# Patient Record
Sex: Female | Born: 1975 | Race: Black or African American | Hispanic: No | Marital: Married | Smoking: Former smoker
Health system: Southern US, Community
[De-identification: ages and names within clinical notes are randomized; demographics above are authoritative.]

## PROBLEM LIST (undated history)

## (undated) ENCOUNTER — Emergency Department (HOSPITAL_COMMUNITY): Discharge status: Against Medical Advice | Payer: Medicaid Other

## (undated) DIAGNOSIS — L680 Hirsutism: Secondary | ICD-10-CM

## (undated) HISTORY — DX: Hirsutism: L68.0

---

## 2003-06-04 ENCOUNTER — Other Ambulatory Visit: Admission: RE | Admit: 2003-06-04 | Discharge: 2003-06-04 | Payer: Self-pay | Admitting: Obstetrics and Gynecology

## 2005-12-30 ENCOUNTER — Emergency Department (HOSPITAL_COMMUNITY): Admission: EM | Admit: 2005-12-30 | Discharge: 2005-12-30 | Payer: Self-pay | Admitting: Emergency Medicine

## 2006-10-21 ENCOUNTER — Ambulatory Visit (HOSPITAL_COMMUNITY): Admission: RE | Admit: 2006-10-21 | Discharge: 2006-10-21 | Payer: Self-pay | Admitting: Obstetrics

## 2006-11-04 ENCOUNTER — Ambulatory Visit (HOSPITAL_COMMUNITY): Admission: RE | Admit: 2006-11-04 | Discharge: 2006-11-04 | Payer: Self-pay | Admitting: Obstetrics

## 2007-01-13 ENCOUNTER — Ambulatory Visit (HOSPITAL_COMMUNITY): Admission: RE | Admit: 2007-01-13 | Discharge: 2007-01-13 | Payer: Self-pay | Admitting: Obstetrics

## 2007-01-30 ENCOUNTER — Inpatient Hospital Stay (HOSPITAL_COMMUNITY): Admission: AD | Admit: 2007-01-30 | Discharge: 2007-01-30 | Payer: Self-pay | Admitting: Obstetrics & Gynecology

## 2007-03-26 ENCOUNTER — Inpatient Hospital Stay (HOSPITAL_COMMUNITY): Admission: RE | Admit: 2007-03-26 | Discharge: 2007-03-29 | Payer: Self-pay | Admitting: Obstetrics

## 2008-04-26 IMAGING — US US OB COMP +14 WK
1 series · 13 of 28 positions shown · non-contrast
Comparison: none

CLINICAL DATA: Anatomic exam.  No current problems.

[Series 1: us ob comp +14 wk · 0.33mm/px · 53 acquisitions, 13 frames shown]
[im 2/53]
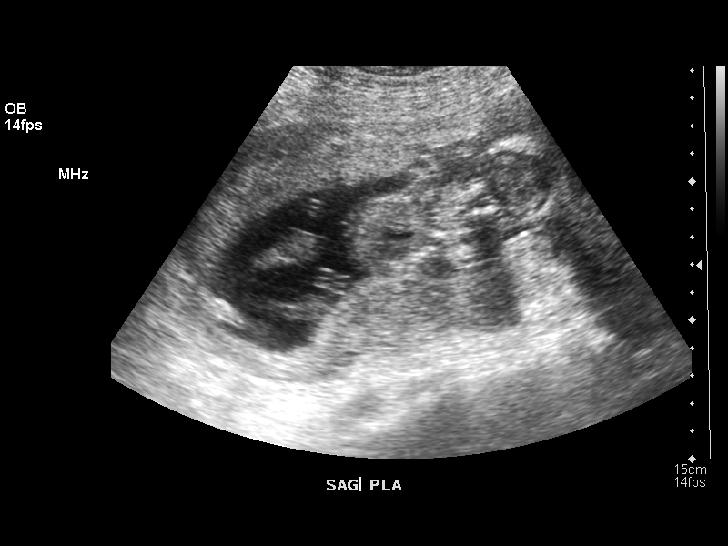
[im 6/53]
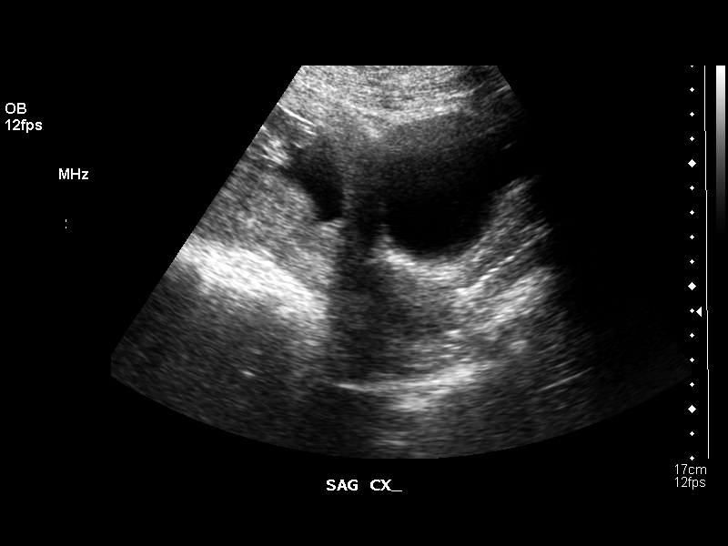
[im 10/53]
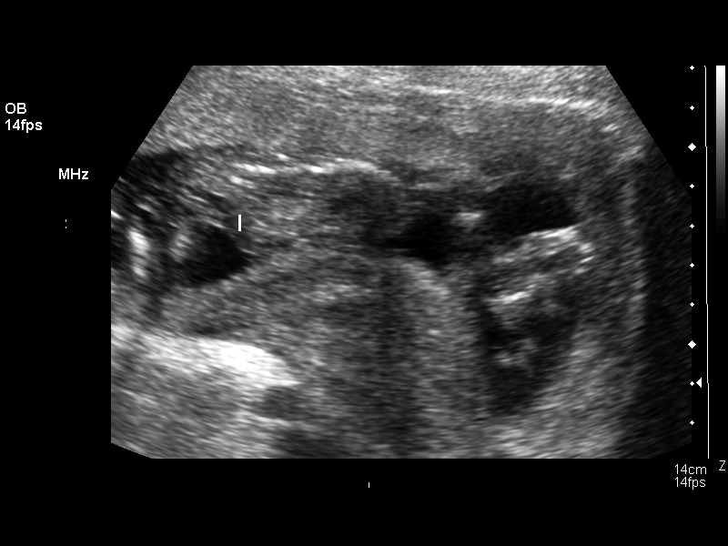
[im 14/53]
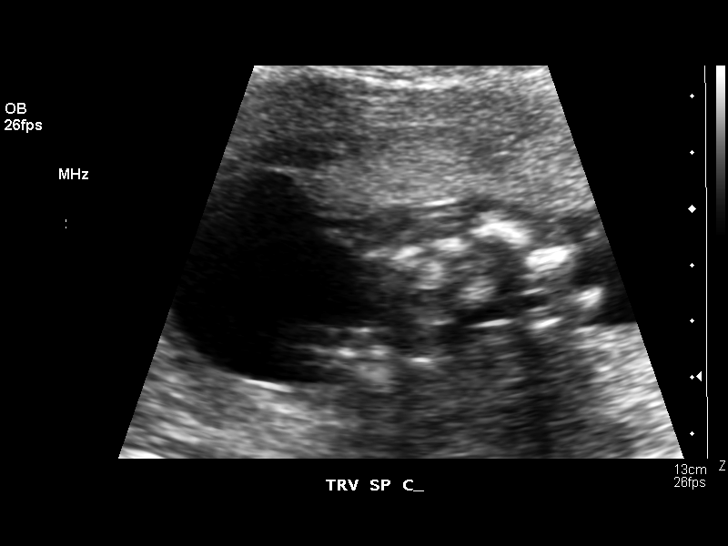
[im 18/53]
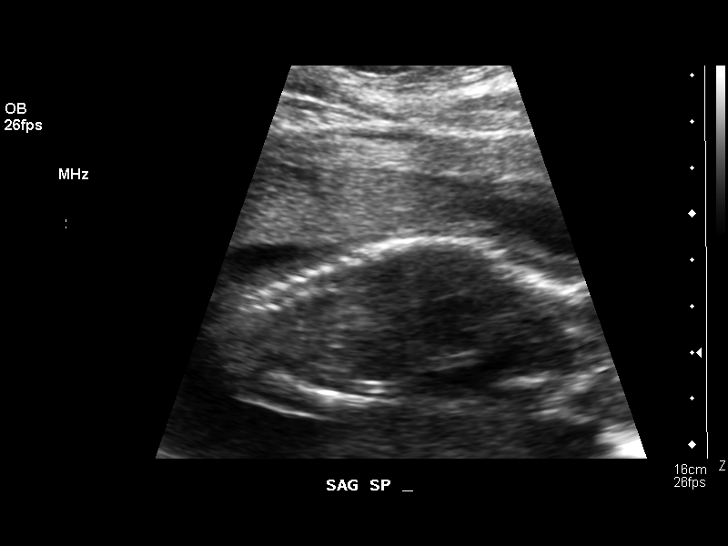
[im 22/53]
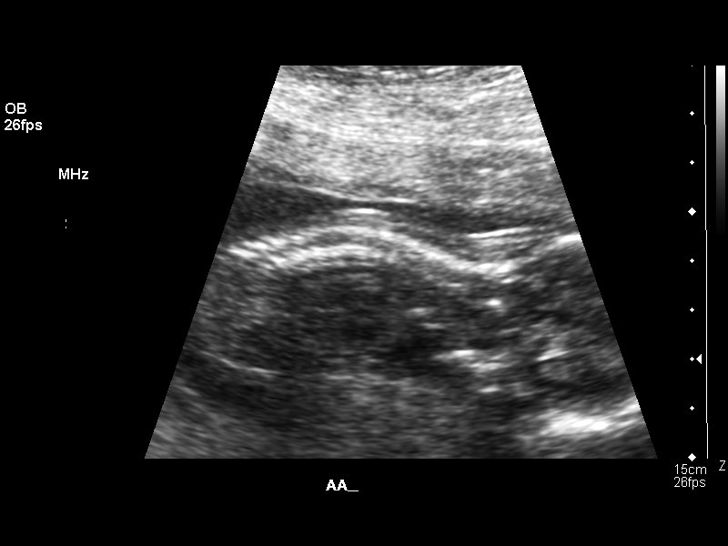
[im 27/53]
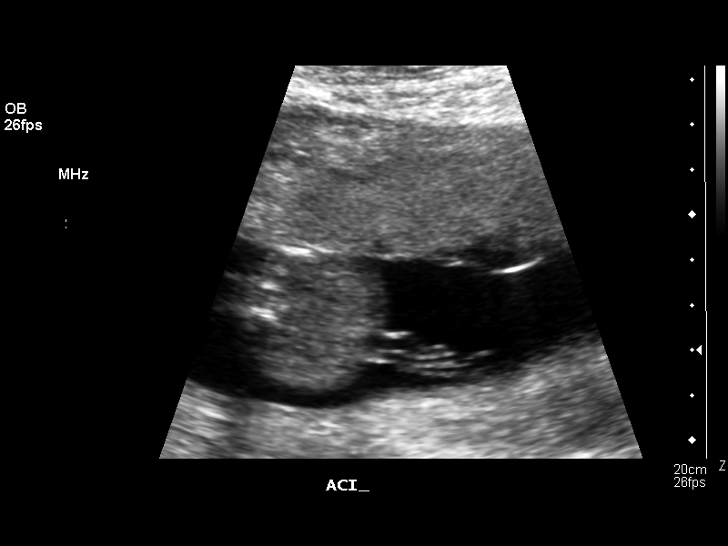
[im 31/53]
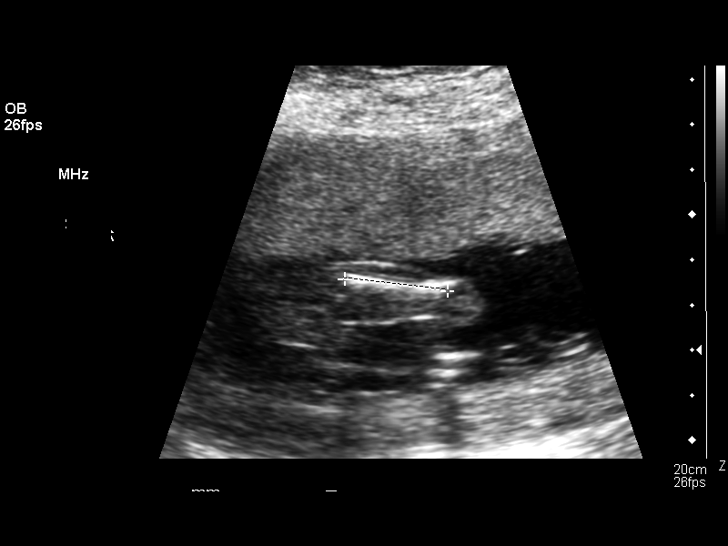
[im 35/53]
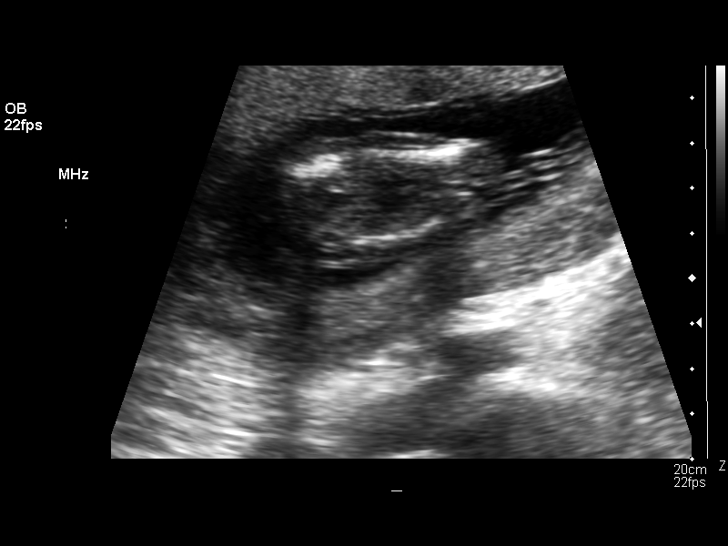
[im 39/53]
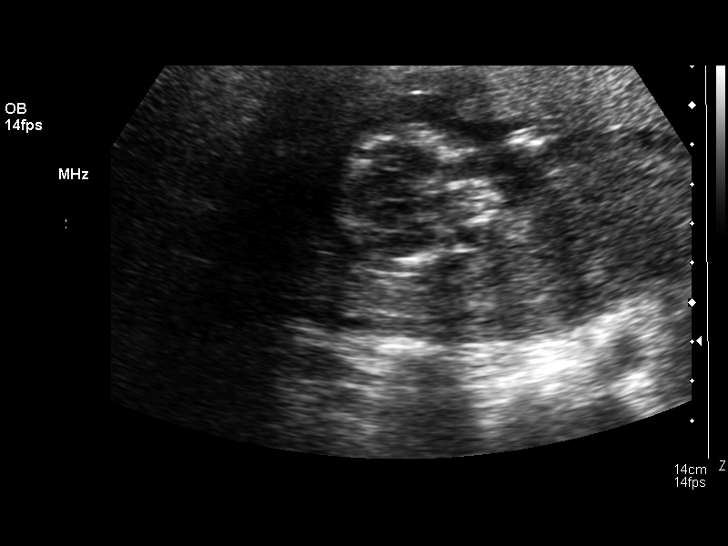
[im 43/53]
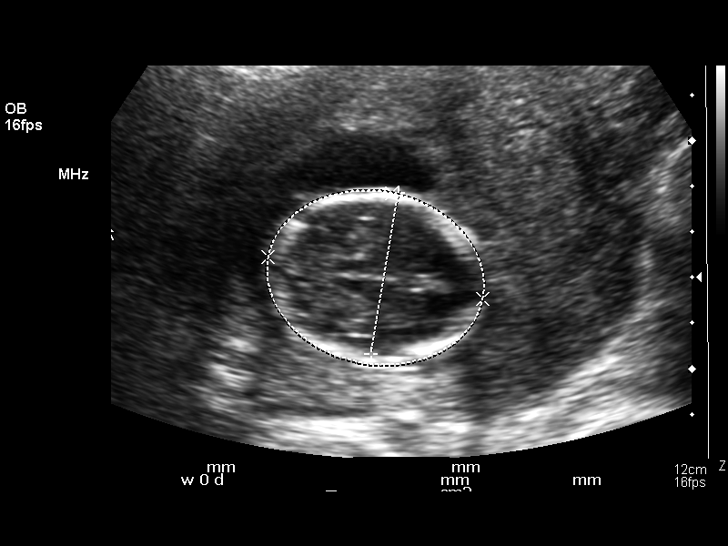
[im 47/53]
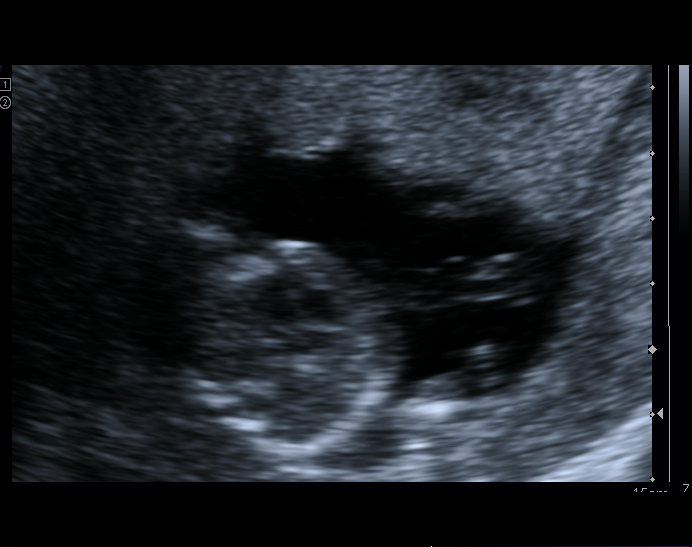
[im 51/53]
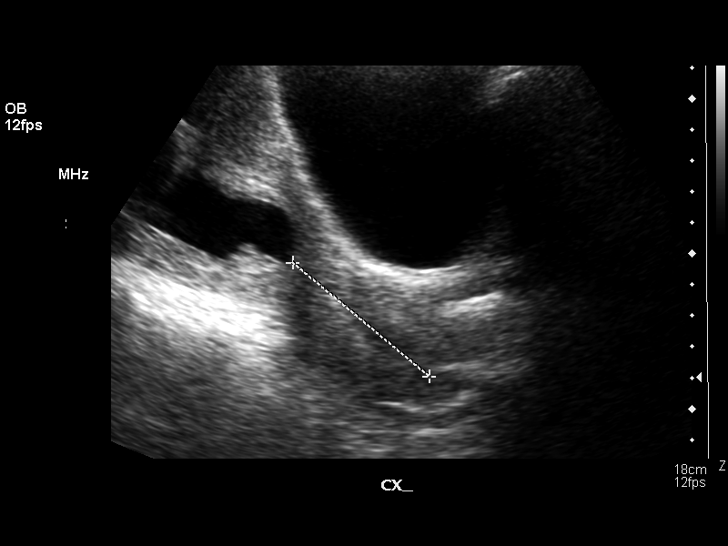

[13 of 28 positions shown; findings below may reference images not displayed]

OBSTETRICAL ULTRASOUND:
 Overall scan resolution was diminished by maternal body habitus.  

 Number of Fetuses:  1
 Heart Rate:  147 bpm
 Movement:  Yes
 Breathing:  No
 Presentation:  Cephalic
 Placental Location:  Anterior
 Grade:  I
 Previa:  No
 Amniotic Fluid (Subjective):  Normal
 Amniotic Fluid (Objective):   3.8 cm vertical pocket 

 FETAL BIOMETRY
 BPD:  3.6 cm   17 w 0 d 
 HC:  13.9 cm   17 w 2 d 
 AC:  11.3 cm   17 w 1 d 
 FL:  2.3 cm   17 w 0 d 

 MEAN GA:  17 w 1 d   US EDC:  03/30/07

 FETAL ANATOMY
 Lateral Ventricles:  Visualized 
 Thalami/CSP:  Visualized 
 Posterior Fossa:  Visualized 
 Nuchal Region:  NF= 4.1 mm   Visualized 
 Spine:  Not visualized 
 4 Chamber Heart on Left:  Not visualized 
 Stomach on Left:  Visualized 
 3 Vessel Cord:  Visualized 
 Cord Insertion site:  Visualized 
 Kidneys:  Visualized 
 Bladder:  Visualized 
 Extremities:  Visualized 

 ADDITIONAL ANATOMY VISUALIZED:  Orbits, diaphragm, and aortic arch.  

 MATERNAL UTERINE AND ADNEXAL FINDINGS
 Cervix:   4.8 cm transabdominal.
IMPRESSION: 1.  Single intrauterine pregnancy demonstrating an estimated gestational age by ultrasound of 17 weeks 1 day.  Correlation with expected estimated gestational age by LMP of 17 weeks 4 days suggests appropriate growth.  An incomplete anatomic evaluation of the fetal heart, spine, facial anatomy, and distal extremities was possible due to poor resolution combined with estimated gestational age.  The patient has been rescheduled at her convenience for reevaluation on 11/04/06 for hopeful improvement in anatomic visualization.  
 2.  Subjectively and quantitatively normal amniotic fluid volume and normal cervical length.

## 2008-07-19 IMAGING — US US OB FOLLOW-UP
1 series · 14 of 28 positions shown · non-contrast
Comparison: none

OBSTETRICAL ULTRASOUND:

 This ultrasound exam was performed in the [HOSPITAL] Ultrasound Department.  The OB US report was generated in the AS system, and faxed to the ordering physician.  This report is also available in [REDACTED] PACS.

[Series 1: us ob follow-up · 0.30mm/px · 14 of 33 slices shown]
[im 2/33]
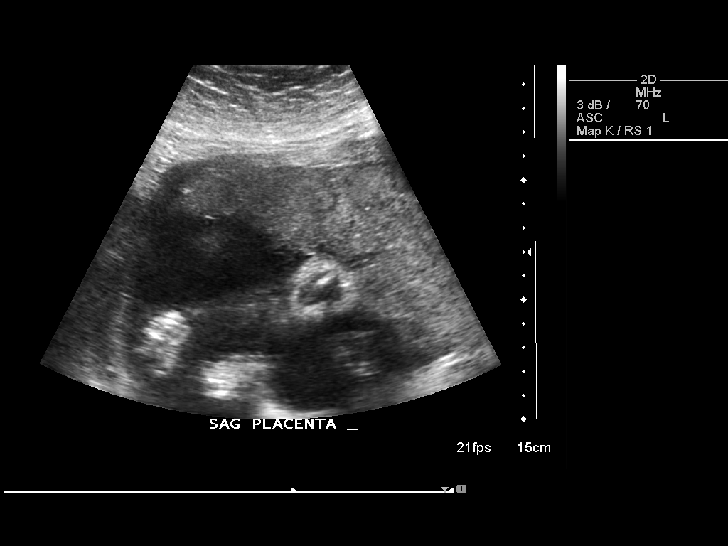
[im 4/33]
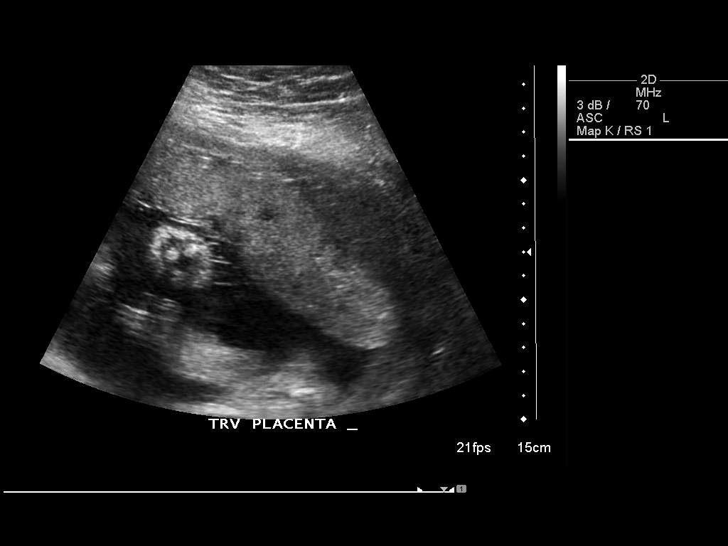
[im 6/33]
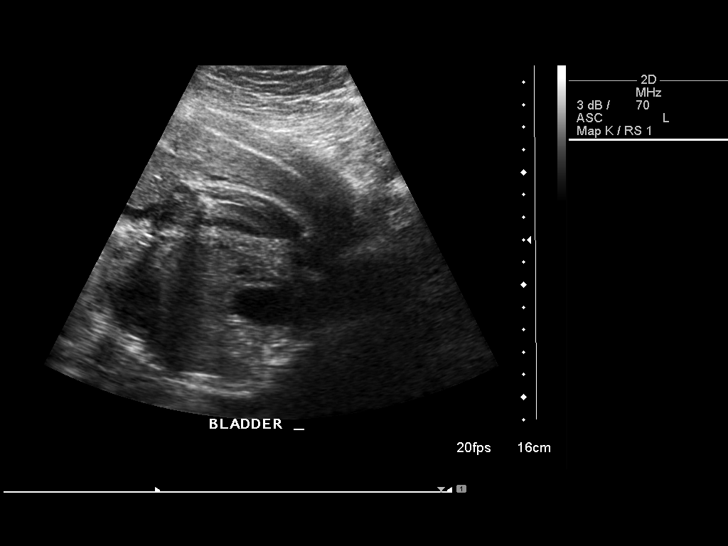
[im 9/33]
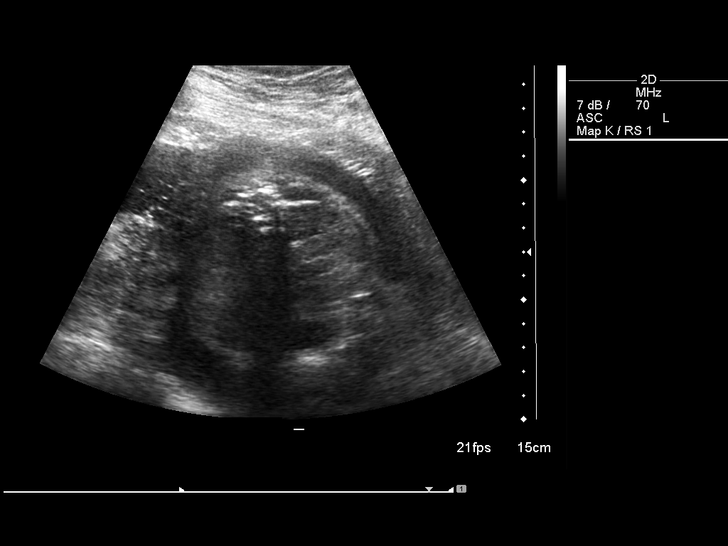
[im 11/33]
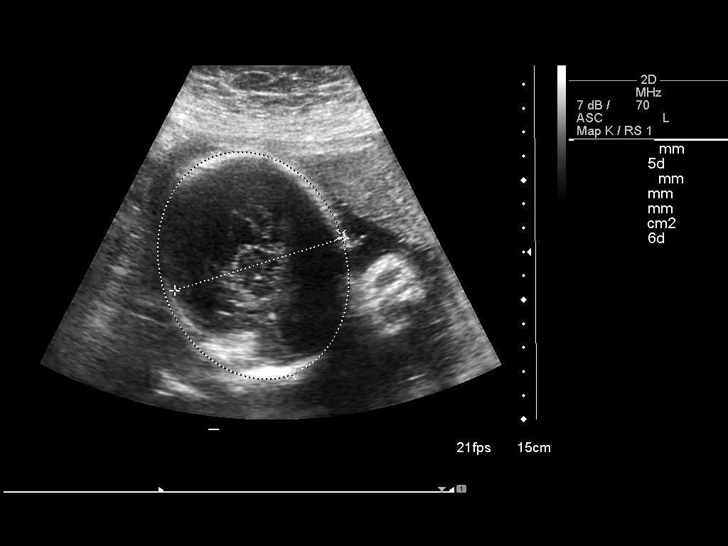
[im 14/33]
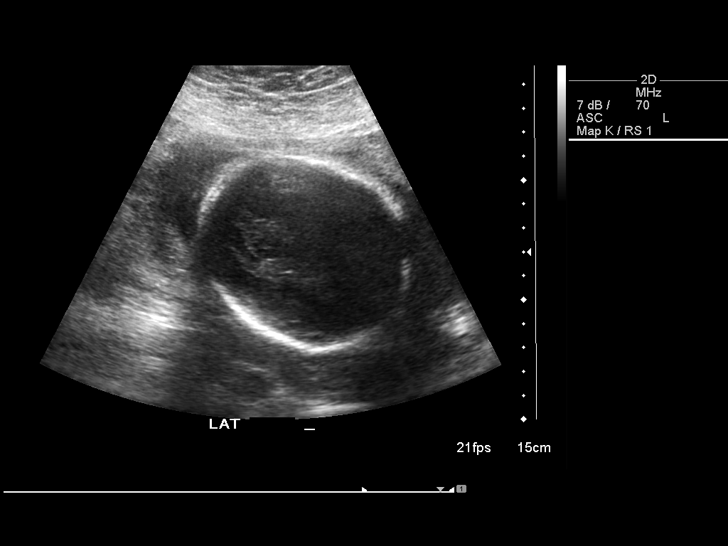
[im 16/33]
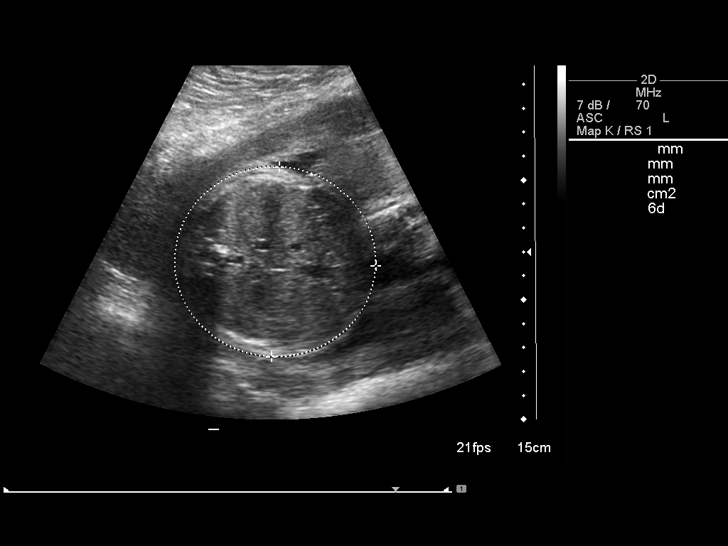
[im 18/33]
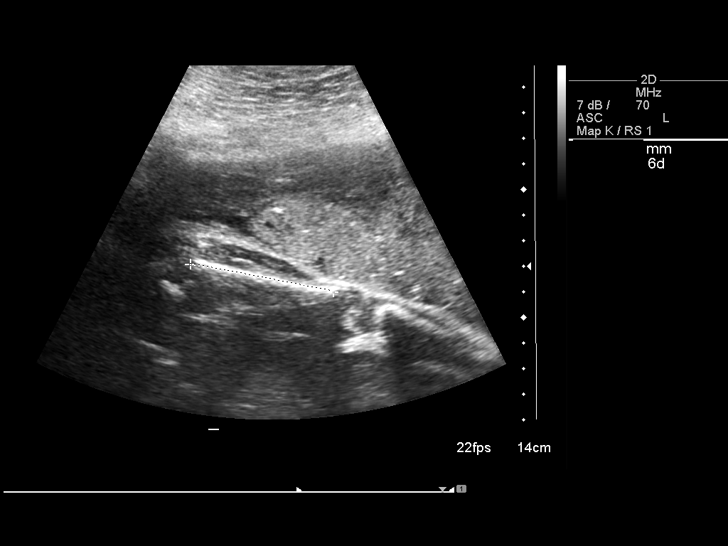
[im 21/33]
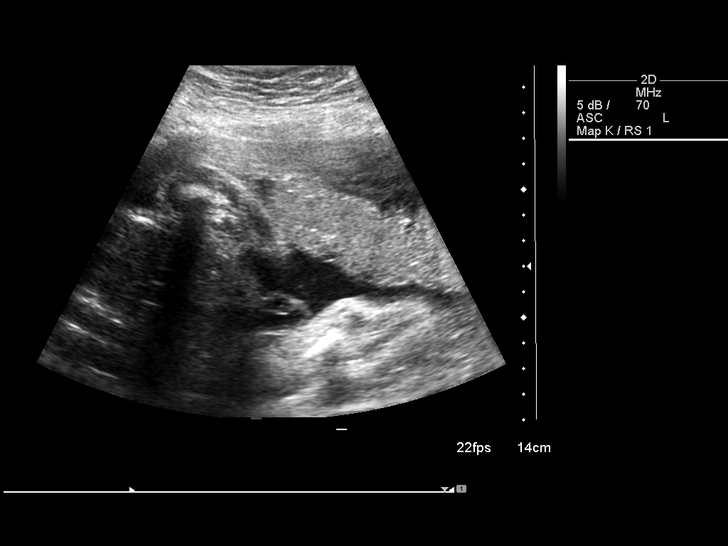
[im 23/33]
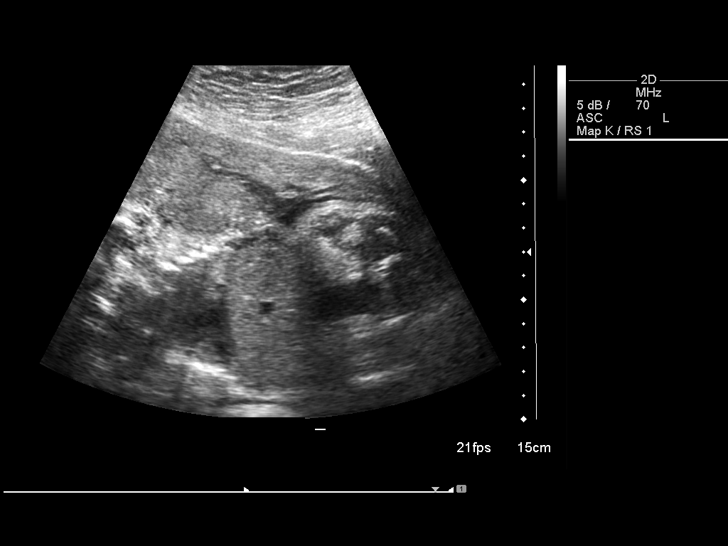
[im 25/33]
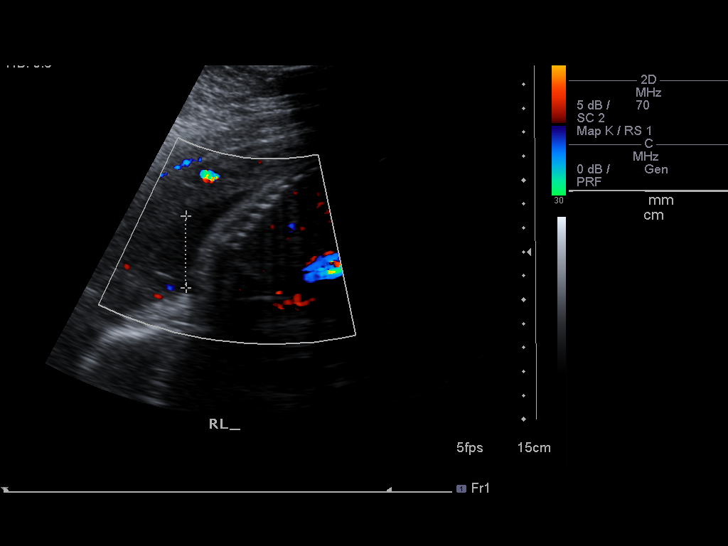
[im 28/33]
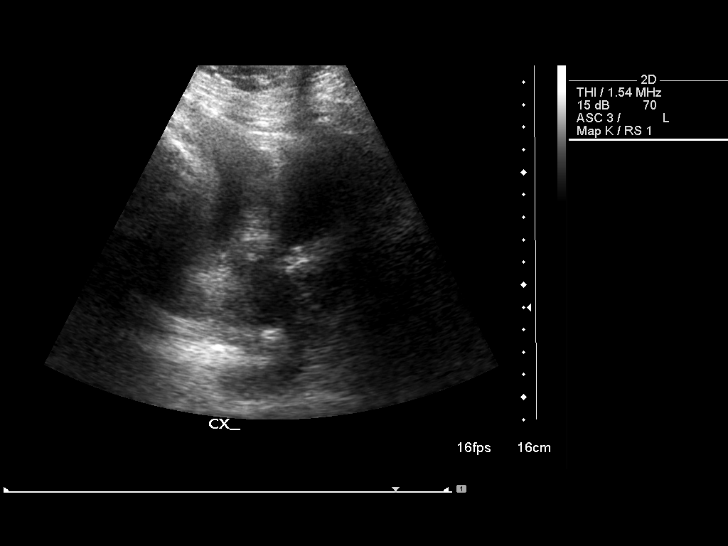
[im 30/33]
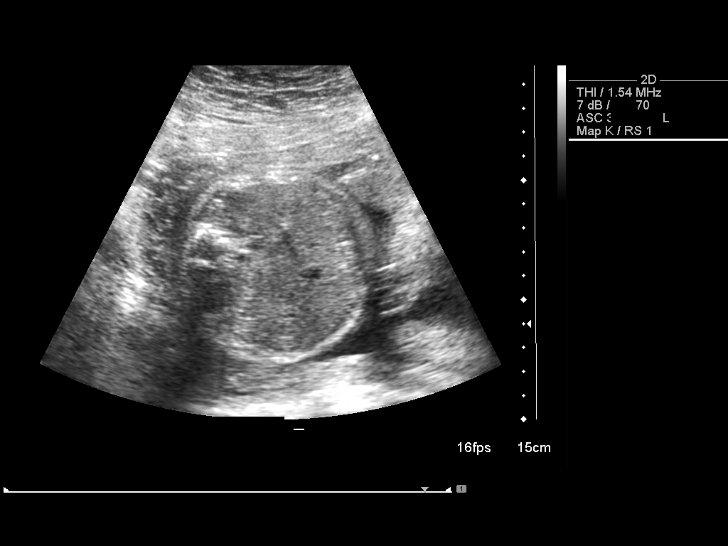
[im 33/33]
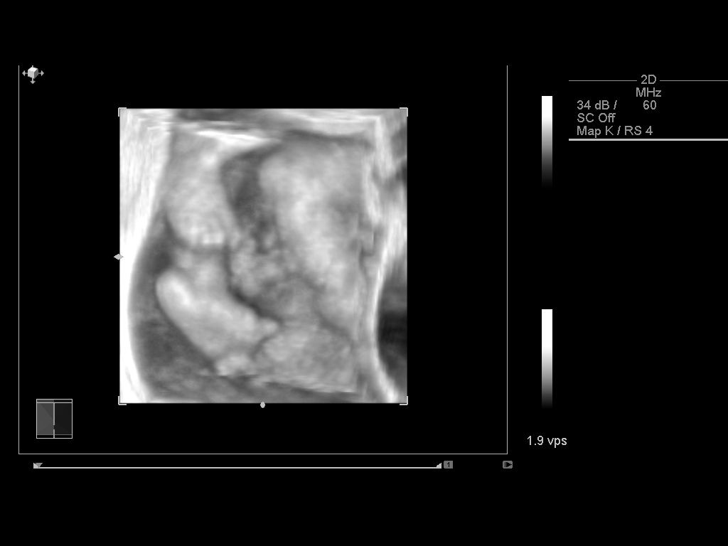

[14 of 28 positions shown; findings below may reference images not displayed]

IMPRESSION: See [facility] Obstetric US report.

## 2012-02-01 ENCOUNTER — Emergency Department (HOSPITAL_COMMUNITY)
Admission: EM | Admit: 2012-02-01 | Discharge: 2012-02-01 | Disposition: A | Discharge status: Home / Self Care | Payer: Self-pay | Attending: Emergency Medicine | Admitting: Emergency Medicine

## 2012-02-01 ENCOUNTER — Encounter (HOSPITAL_COMMUNITY): Payer: Self-pay | Admitting: Emergency Medicine

## 2012-02-01 DIAGNOSIS — I10 Essential (primary) hypertension: Secondary | ICD-10-CM | POA: Insufficient documentation

## 2012-02-01 DIAGNOSIS — J029 Acute pharyngitis, unspecified: Secondary | ICD-10-CM | POA: Insufficient documentation

## 2012-02-01 LAB — RAPID STREP SCREEN (MED CTR MEBANE ONLY): Streptococcus, Group A Screen (Direct): NEGATIVE

## 2012-02-01 MED ORDER — ACETAMINOPHEN 160 MG/5ML PO SOLN
ORAL | Status: AC
  Administered 2012-02-01: 640 mg
  Filled 2012-02-01: qty 20

## 2012-02-01 MED ORDER — PREDNISOLONE 15 MG/5ML PO SYRP: 60.0000 mg | ORAL_SOLUTION | Freq: Every day | ORAL | Status: AC

## 2012-02-01 MED ORDER — HYDROCODONE-ACETAMINOPHEN 7.5-500 MG/15ML PO SOLN: 15.0000 mL | Freq: Four times a day (QID) | ORAL | Status: AC | PRN

## 2012-02-01 MED ORDER — ACETAMINOPHEN 325 MG PO TABS: 650.0000 mg | ORAL_TABLET | Freq: Once | ORAL | Status: DC

## 2012-02-01 MED ORDER — CEPHALEXIN 250 MG/5ML PO SUSR: 1000.0000 mg | Freq: Two times a day (BID) | ORAL | Status: AC

## 2012-02-01 NOTE — ED Provider Notes (Signed)
Medical screening examination/treatment/procedure(s) were conducted as a shared visit with non-physician practitioner(s) and myself.  I personally evaluated the patient during the encounter  This 36 year old female has 2 days of a sore throat with no stridor no drooling but does have a mild voice change. She has fever with tender anterior cervical lymphadenopathy with no trismus. Her uvula is midline she does have bilateral symmetric exudative tonsillitis but no impending airway compromise.  Hurman Horn, MD 02/03/12 [phone]

## 2012-02-01 NOTE — ED Provider Notes (Signed)
History     CSN: [id number]  Arrival date & time 02/01/12  1237   First MD Initiated Contact with Patient 02/01/12 1320      Chief Complaint  Patient presents with  . Sore Throat    Pt reports 2 day hx of sore throat. Severe throat pain, "can not swallow" due to pain. Throat is red    (Consider location/radiation/quality/duration/timing/severity/associated sxs/prior treatment) HPI  Sore Throat: Patient complains of sore throat. Associated symptoms include pain while swallowing, sore throat, swollen glands and white spots in throat.Onset of symptoms was 2 days ago, gradually worsening since that time. She is drinking moderate amounts of fluids. She has not had recent close exposure to someone with proven streptococcal pharyngitis. Pt does have a mild change in voice as well as pain on swollowing, but she is handling her secretions well.   Past Medical History  Diagnosis Date  . Hypertension     Past Surgical History  Procedure Date  . Cesarean section     Family History  Problem Relation Age of Onset  . Diabetes Mother   . Hypertension Mother   . Diabetes Father     History  Substance Use Topics  . Smoking status: Never Smoker   . Smokeless tobacco: Not on file  . Alcohol Use: Yes    OB History    Grav Para Term Preterm Abortions TAB SAB Ect Mult Living                  Review of Systems  All other systems reviewed and are negative.    Allergies  Review of patient's allergies indicates no known allergies.  Home Medications   Current Outpatient Rx  Name Route Sig Dispense Refill  . ADULT MULTIVITAMIN W/MINERALS CH Oral Take 1 tablet by mouth daily.    . DAYQUIL PO Oral Take 30 mLs by mouth every 4 (four) hours as needed. For cold symptoms.    . CEPHALEXIN 250 MG/5ML PO SUSR Oral Take 20 mLs (1,000 mg total) by mouth 2 (two) times daily. 560 mL 0  . HYDROCODONE-ACETAMINOPHEN 7.5-500 MG/15ML PO SOLN Oral Take 15 mLs by mouth every 6 (six) hours as needed  for pain. 120 mL 0  . PREDNISOLONE 15 MG/5ML PO SYRP Oral Take 20 mLs (60 mg total) by mouth daily. 60 mL 0    For 3 days    BP 127/83  Pulse 117  Temp(Src) 102.7 F (39.3 C) (Oral)  Resp 20  SpO2 100%  LMP 01/27/2012  Physical Exam  Nursing note and vitals reviewed. Constitutional: She is oriented to person, place, and time. She appears well-developed and well-nourished. No distress.  HENT:  Head: Normocephalic and atraumatic. No trismus in the jaw.  Right Ear: Tympanic membrane, external ear and ear canal normal.  Left Ear: Tympanic membrane, external ear and ear canal normal.  Nose: Nose normal. No rhinorrhea. Right sinus exhibits no maxillary sinus tenderness and no frontal sinus tenderness. Left sinus exhibits no maxillary sinus tenderness and no frontal sinus tenderness.  Mouth/Throat: Uvula is midline and mucous membranes are normal. Normal dentition. No dental abscesses or uvula swelling. Oropharyngeal exudate and posterior oropharyngeal edema present. No posterior oropharyngeal erythema or tonsillar abscesses.       No submental edema, tongue not elevated, no trismus. No impending airway obstruction; Pt able to speak full sentences, swallow intact, no drooling, stridor, or tonsillar/uvula displacement. No palatal petechia  Eyes: Conjunctivae are normal.  Neck: Trachea normal, normal range of  motion and full passive range of motion without pain. Neck supple. No rigidity. Erythema present. Normal range of motion present. No Brudzinski's sign noted.       Flexion and extension of neck without pain or difficulty. Able to breath without difficulty in extension.  Cardiovascular: Normal rate and regular rhythm.   Pulmonary/Chest: Effort normal and breath sounds normal. No stridor. No respiratory distress. She has no wheezes.  Abdominal: Soft. There is no tenderness.       No obvious evidence of splenomegaly. Non ttp.   Musculoskeletal: Normal range of motion.  Lymphadenopathy:        Head (right side): No preauricular and no posterior auricular adenopathy present.       Head (left side): No preauricular and no posterior auricular adenopathy present.    She has cervical adenopathy.  Neurological: She is alert and oriented to person, place, and time.  Skin: Skin is warm and dry. No rash noted. She is not diaphoretic.  Psychiatric: She has a normal mood and affect.    ED Course  Procedures (including critical care time)   Labs Reviewed  RAPID STREP SCREEN   No results found.   1. Sore throat       MDM  Dr. Fonnie Jarvis evaluated the patient as well. He has recommended I prescribe Prelone 60mg  liquid for 3 days.Keflex liquid 1g BID for 7 days, Lortab elixer 7.5mg .        Dorthula Matas, PA 02/01/12 1407

## 2012-02-01 NOTE — Discharge Instructions (Signed)
Sore Throat Sore throats may be caused by bacteria and viruses. They may also be caused by:  Smoking.   Pollution.   Allergies.  If a sore throat is due to strep infection (a bacterial infection), you may need:  A throat swab.   A culture test to verify the strep infection.  You will need one of these:  An antibiotic shot.   Oral medicine for a full 10 days.  Strep infection is very contagious. A doctor should check any close contacts who have a sore throat or fever. A sore throat caused by a virus infection will usually last only 3-4 days. Antibiotics will not treat a viral sore throat.  Infectious mononucleosis (a viral disease), however, can cause a sore throat that lasts for up to 3 weeks. Mononucleosis can be diagnosed with blood tests. You must have been sick for at least 1 week in order for the test to give accurate results. HOME CARE INSTRUCTIONS   To treat a sore throat, take mild pain medicine.   Increase your fluids.   Eat a soft diet.   Do not smoke.   Gargling with warm water or salt water (1 tsp. salt in 8 oz. water) can be helpful.   Try throat sprays or lozenges or sucking on hard candy to ease the symptoms.  Call your doctor if your sore throat lasts longer than 1 week.  SEEK IMMEDIATE MEDICAL CARE IF:  You have difficulty breathing.   You have increased swelling in the throat.   You have pain so severe that you are unable to swallow fluids or your saliva.   You have a severe headache, a high fever, vomiting, or a red rash.  Document Released: 12/27/2004 Document Revised: 08/01/2011 Document Reviewed: 11/06/2007 [facility] Patient Information 2012 [place], [place].Infectious Mononucleosis Mono (infectiousmononucleosis) is a common viral infection that is caused by Epstein-Barr virus (EBV). Mono is contagious, and is commonly spread via saliva. This is why it is also known as the "kissing disease." Often, children with the virus may have no symptoms. However,  in adults and adolescents, mono may cause an individual to miss days of work or school. SYMPTOMS   No symptoms, for up to a month after being infected.   Extreme fatigue.   Tiredness (sleeping 12 to 16 hours a day.)   Fever.   Headaches.   Muscle aches.   Sore throat.   Swollen bumps on the neck that you can feel, and are tender (lymph nodes).   Loss of appetite.   Nausea.   Joint aches.   Rash.   Feeling of fullness in your stomach.  PREVENTION   Avoid contact with infected saliva.   Avoid sharing eating utensils.   Avoid sharing food.  TREATMENT  Mono has no specific treatment. It is recommended that individuals with the illness rest and drink plenty of fluids. Over-the-counter medicines for fever and sore throat may be taken, if such symptoms are present. Rarely, the infection may cause an abscess (collection of pus surrounded by inflamed tissue) in the tonsils, for which antibiotics will be prescribed. Mono typically causes the liver and spleen to become enlarged. For this reason, you should avoid drinking alcohol, contact sports, heavy lifting, or any strenuous exercise, to reduce the risk of rupturing your spleen, until it returns to normal size. Symptoms typically improve after 1 to 2 weeks, but returning to sports may take a couple months. Document Released: 11/19/2005 Document Revised: 08/01/2011 Document Reviewed: 03/03/2009 [employer] Patient Information 2012  [employer].Strep Throat Strep throat is an infection of the throat caused by a bacteria named Streptococcus pyogenes. Your caregiver may call the infection streptococcal "tonsillitis" or "pharyngitis" depending on whether there are signs of inflammation in the tonsils or back of the throat. Strep throat is most common in children from 56 to 69 years old during the cold months of the year, but it can occur in people of any age during any season. This infection is spread from person to person (contagious)  through coughing, sneezing, or other close contact. SYMPTOMS   Fever or chills.   Painful, swollen, red tonsils or throat.   Pain or difficulty when swallowing.   White or yellow spots on the tonsils or throat.   Swollen, tender lymph nodes or "glands" of the neck or under the jaw.   Red rash all over the body (rare).  DIAGNOSIS  Many different infections can cause the same symptoms. A test must be done to confirm the diagnosis so the right treatment can be given. A "rapid strep test" can help your caregiver make the diagnosis in a few minutes. If this test is not available, a light swab of the infected area can be used for a throat culture test. If a throat culture test is done, results are usually available in a day or two. TREATMENT  Strep throat is treated with antibiotic medicine. HOME CARE INSTRUCTIONS   Gargle with 1 tsp of salt in 1 cup of warm water, 3 to 4 times per day or as needed for comfort.   Family members who also have a sore throat or fever should be tested for strep throat and treated with antibiotics if they have the strep infection.   Make sure everyone in your household washes their hands well.   Do not share food, drinking cups, or personal items that could cause the infection to spread to others.   You may need to eat a soft food diet until your sore throat gets better.   Drink enough water and fluids to keep your urine clear or pale yellow. This will help prevent dehydration.   Get plenty of rest.   Stay home from school, daycare, or work until you have been on antibiotics for 24 hours.   Only take over-the-counter or prescription medicines for pain, discomfort, or fever as directed by your caregiver.   If antibiotics are prescribed, take them as directed. Finish them even if you start to feel better.  SEEK MEDICAL CARE IF:   The glands in your neck continue to enlarge.   You develop a rash, cough, or earache.   You cough up green, yellow-brown,  or bloody sputum.   You have pain or discomfort not controlled by medicines.   Your problems seem to be getting worse rather than better.  SEEK IMMEDIATE MEDICAL CARE IF:   You develop any new symptoms such as vomiting, severe headache, stiff or painful neck, chest pain, shortness of breath, or trouble swallowing.   You develop severe throat pain, drooling, or changes in your voice.   You develop swelling of the neck, or the skin on the neck becomes red and tender.   You have a fever.   You develop signs of dehydration, such as fatigue, dry mouth, and decreased urination.   You become increasingly sleepy, or you cannot wake up completely.  Document Released: 11/16/2000 Document Revised: 08/01/2011 Document Reviewed: 01/18/2011 [facility] Patient Information 2012 [place], [place].

## 2012-02-03 NOTE — ED Provider Notes (Signed)
Medical screening examination/treatment/procedure(s) were conducted as a shared visit with non-physician practitioner(s) and myself.  I personally evaluated the patient during the encounter  Hurman Horn, MD 02/03/12 2249

## 2012-12-25 ENCOUNTER — Encounter (HOSPITAL_COMMUNITY): Payer: Self-pay | Admitting: Emergency Medicine

## 2012-12-25 ENCOUNTER — Emergency Department (HOSPITAL_COMMUNITY): Payer: Medicaid Other

## 2012-12-25 ENCOUNTER — Emergency Department (HOSPITAL_COMMUNITY)
Admission: EM | Admit: 2012-12-25 | Discharge: 2012-12-25 | Disposition: A | Discharge status: Home / Self Care | Payer: Medicaid Other | Attending: Emergency Medicine | Admitting: Emergency Medicine

## 2012-12-25 DIAGNOSIS — I10 Essential (primary) hypertension: Secondary | ICD-10-CM | POA: Insufficient documentation

## 2012-12-25 DIAGNOSIS — J36 Peritonsillar abscess: Secondary | ICD-10-CM | POA: Insufficient documentation

## 2012-12-25 DIAGNOSIS — R5381 Other malaise: Secondary | ICD-10-CM | POA: Insufficient documentation

## 2012-12-25 DIAGNOSIS — R110 Nausea: Secondary | ICD-10-CM | POA: Insufficient documentation

## 2012-12-25 DIAGNOSIS — F17200 Nicotine dependence, unspecified, uncomplicated: Secondary | ICD-10-CM | POA: Insufficient documentation

## 2012-12-25 DIAGNOSIS — Z3202 Encounter for pregnancy test, result negative: Secondary | ICD-10-CM | POA: Insufficient documentation

## 2012-12-25 LAB — POCT I-STAT, CHEM 8
Calcium, Ion: 1.18 mmol/L (ref 1.12–1.23)
Creatinine, Ser: 0.7 mg/dL (ref 0.50–1.10)
Glucose, Bld: 100 mg/dL — ABNORMAL HIGH (ref 70–99)
Hemoglobin: 12.9 g/dL (ref 12.0–15.0)
Potassium: 3.3 mEq/L — ABNORMAL LOW (ref 3.5–5.1)

## 2012-12-25 LAB — RAPID STREP SCREEN (MED CTR MEBANE ONLY): Streptococcus, Group A Screen (Direct): NEGATIVE

## 2012-12-25 LAB — POCT PREGNANCY, URINE: Preg Test, Ur: NEGATIVE

## 2012-12-25 MED ORDER — IOHEXOL 300 MG/ML  SOLN
100.0000 mL | Freq: Once | INTRAMUSCULAR | Status: AC | PRN
  Administered 2012-12-25: 80 mL via INTRAVENOUS

## 2012-12-25 NOTE — ED Notes (Signed)
Pt complains of sore throat and swelling x 2 days.

## 2012-12-25 NOTE — ED Notes (Signed)
MD at bedside. 

## 2012-12-25 NOTE — ED Notes (Signed)
Patient transported to CT 

## 2012-12-25 NOTE — ED Provider Notes (Signed)
History     CSN: [id number]  Arrival date & time 12/25/12  1359   First MD Initiated Contact with Patient 12/25/12 1436      Chief Complaint  Patient presents with  . Sore Throat    (Consider location/radiation/quality/duration/timing/severity/associated sxs/prior treatment) Patient is a 37 y.o. female presenting with pharyngitis.  Sore Throat Associated symptoms include fatigue, nausea and a sore throat. Pertinent negatives include no chest pain, congestion, coughing, fever, neck pain, rash, vomiting or weakness.  Patient presents to the emergency department with a two day history of sore throat and throat swelling. She states that she is unable to swallow due to pain and that the pain radiates up to her right ear. She has only eaten a few hard candies, taken Nyquil, and had a little bit of hydrocodone liquid from her previous bout with a similar problem in March. The patient denies Peritonsillar abscess in the past.  The patient denies chest pain, shortness of breath, headache, weakness, vomiting, diarrhea, or dizziness.   Past Medical History  Diagnosis Date  . Hypertension     Past Surgical History  Procedure Date  . Cesarean section     Family History  Problem Relation Age of Onset  . Diabetes Mother   . Hypertension Mother   . Diabetes Father     History  Substance Use Topics  . Smoking status: Current Every Day Smoker  . Smokeless tobacco: Not on file  . Alcohol Use: Yes    OB History    Grav Para Term Preterm Abortions TAB SAB Ect Mult Living                  Review of Systems  Constitutional: Positive for fatigue. Negative for fever.  HENT: Positive for ear pain, sore throat, trouble swallowing and voice change. Negative for hearing loss, congestion, drooling, neck pain, neck stiffness and ear discharge.   Respiratory: Negative for cough, chest tightness and shortness of breath.   Cardiovascular: Negative for chest pain.  Gastrointestinal: Positive for  nausea. Negative for vomiting and diarrhea.  Skin: Negative for rash.  Neurological: Negative for weakness.  Hematological: Positive for adenopathy.    Allergies  Review of patient's allergies indicates no known allergies.  Home Medications   Current Outpatient Rx  Name  Route  Sig  Dispense  Refill  . HYDROCODONE-ACETAMINOPHEN 5-325 MG PO TABS   Oral   Take 1 tablet by mouth every 6 (six) hours as needed. For pain.         Marland Kitchen NYQUIL PO   Oral   Take 30 mLs by mouth at bedtime as needed. For cold symptoms.         . DAYQUIL PO   Oral   Take 30 mLs by mouth every 4 (four) hours as needed. For cold symptoms.           BP 134/86  Pulse 83  Temp 98.7 F (37.1 C) (Oral)  Resp 18  SpO2 100%  Physical Exam  Constitutional: She is oriented to person, place, and time. She appears well-developed and well-nourished.  HENT:  Head: Normocephalic and atraumatic. No trismus in the jaw.  Right Ear: Tympanic membrane, external ear and ear canal normal.  Left Ear: Tympanic membrane, external ear and ear canal normal.  Mouth/Throat: Posterior oropharyngeal edema and posterior oropharyngeal erythema present.    Eyes: Conjunctivae normal are normal.  Cardiovascular: Normal rate.   Pulmonary/Chest: Effort normal and breath sounds normal. No stridor.  Lymphadenopathy:  She has cervical adenopathy.  Neurological: She is alert and oriented to person, place, and time.  Skin: Skin is warm and dry. No rash noted. No erythema. No pallor.    ED Course  Procedures (including critical care time)   The patient will need CT scan to further examination. Patient given the plan. Patient is stable at this time.   MDM          Carlyle Dolly, PA-C 12/25/12 1502

## 2012-12-25 NOTE — ED Provider Notes (Addendum)
Medical screening examination/treatment/procedure(s) were conducted as a shared visit with non-physician practitioner(s) and myself.  I personally evaluated the patient during the encounter Pt with right side PTA.  No current airway issues.  Discussed with ENT, DR Emeline Darling, he requests to send pt directly to their office for drainage. Rolan Bucco, MD 12/25/12 1510  Rolan Bucco, MD 12/25/12 1549

## 2013-03-02 ENCOUNTER — Emergency Department (HOSPITAL_COMMUNITY)
Admission: EM | Admit: 2013-03-02 | Discharge: 2013-03-02 | Disposition: A | Discharge status: Home / Self Care | Payer: Self-pay | Attending: Emergency Medicine | Admitting: Emergency Medicine

## 2013-03-02 ENCOUNTER — Encounter (HOSPITAL_COMMUNITY): Payer: Self-pay

## 2013-03-02 DIAGNOSIS — R609 Edema, unspecified: Secondary | ICD-10-CM | POA: Insufficient documentation

## 2013-03-02 DIAGNOSIS — J039 Acute tonsillitis, unspecified: Secondary | ICD-10-CM | POA: Insufficient documentation

## 2013-03-02 DIAGNOSIS — F17200 Nicotine dependence, unspecified, uncomplicated: Secondary | ICD-10-CM | POA: Insufficient documentation

## 2013-03-02 DIAGNOSIS — H9209 Otalgia, unspecified ear: Secondary | ICD-10-CM | POA: Insufficient documentation

## 2013-03-02 DIAGNOSIS — R509 Fever, unspecified: Secondary | ICD-10-CM | POA: Insufficient documentation

## 2013-03-02 DIAGNOSIS — R1310 Dysphagia, unspecified: Secondary | ICD-10-CM | POA: Insufficient documentation

## 2013-03-02 LAB — RAPID STREP SCREEN (MED CTR MEBANE ONLY): Streptococcus, Group A Screen (Direct): POSITIVE — AB

## 2013-03-02 MED ORDER — AMOXICILLIN 400 MG/5ML PO SUSR: 1000.0000 mg | Freq: Three times a day (TID) | ORAL | Status: AC

## 2013-03-02 MED ORDER — PENICILLIN G BENZATHINE 1200000 UNIT/2ML IM SUSP
1.2000 10*6.[IU] | Freq: Once | INTRAMUSCULAR | Status: AC
  Administered 2013-03-02: 1.2 10*6.[IU] via INTRAMUSCULAR
  Filled 2013-03-02: qty 2

## 2013-03-02 MED ORDER — HYDROCODONE-ACETAMINOPHEN 7.5-500 MG/15ML PO SOLN: 15.0000 mL | Freq: Four times a day (QID) | ORAL | Status: DC | PRN

## 2013-03-02 MED ORDER — DEXAMETHASONE SODIUM PHOSPHATE 10 MG/ML IJ SOLN
10.0000 mg | Freq: Once | INTRAMUSCULAR | Status: AC
  Administered 2013-03-02: 10 mg via INTRAMUSCULAR
  Filled 2013-03-02: qty 1

## 2013-03-02 NOTE — ED Notes (Signed)
Pt escorted to discharge window. Verbalized understanding discharge instructions. In no acute distress.   

## 2013-03-02 NOTE — ED Notes (Signed)
Pt c/o sore throat x 3 days.  Difficulty swallowing.  Pain score 8/10.  Denies SOB.  NAD noted.

## 2013-03-02 NOTE — ED Provider Notes (Signed)
Medical screening examination/treatment/procedure(s) were performed by non-physician practitioner and as supervising physician I was immediately available for consultation/collaboration.    Manasa Spease L Tangala Wiegert, MD 03/02/13 1524 

## 2013-03-02 NOTE — Progress Notes (Signed)
Pt confirms pcp is  [facility] EPIC updated

## 2013-03-02 NOTE — ED Provider Notes (Signed)
History     CSN: [id number]  Arrival date & time 03/02/13  1111   First MD Initiated Contact with Patient 03/02/13 1146      Chief Complaint  Patient presents with  . Sore Throat    (Consider location/radiation/quality/duration/timing/severity/associated sxs/prior treatment) HPI Diana Mcbride is a 37 year old female with past history of HTN who presents to the ED with pharyngitis. Her pharyngitis started 2 days ago and she reports having a fever of 101 F. She states the pain has gotten worse. She reports odynophagia, otalgia, and edema. She states her neck is tender to touch. She has tried Tylenol for the fever, which she hasn't had fever since Saturday, and NyQuil, DayQuil, and Zyrtec for her pharyngitis. She reports very mild relief of symptoms with the medications. She states she hoped the Zyrtec would help decrease the swelling in her throat. She reports history of peritonsillar abscess 2 months ago. She states she frequently has severe pharyngitis. She saw ENT and was told she needs a tonsillectomy, but has been unable due to work and school schedules. She denies headache, cough, rhinorrhea, nausea, vomiting, chest pain, or SOB. She does report having to cough up some mucus occasionally, and states it is whitish.  History reviewed. No pertinent past medical history.  Past Surgical History  Procedure Laterality Date  . Cesarean section      Family History  Problem Relation Age of Onset  . Diabetes Mother   . Hypertension Mother   . Diabetes Father     History  Substance Use Topics  . Smoking status: Current Some Day Smoker  . Smokeless tobacco: Not on file  . Alcohol Use: Yes     Comment: occasionally    OB History   Grav Para Term Preterm Abortions TAB SAB Ect Mult Living                  Review of Systems All other systems negative except as documented in the HPI. All pertinent positives and negatives as reviewed in the HPI.  Allergies  Review of patient's  allergies indicates no known allergies.  Home Medications   Current Outpatient Rx  Name  Route  Sig  Dispense  Refill  . HYDROcodone-acetaminophen (NORCO/VICODIN) 5-325 MG per tablet   Oral   Take 1 tablet by mouth every 6 (six) hours as needed. For pain.         . Pseudoeph-Doxylamine-DM-APAP (NYQUIL PO)   Oral   Take 30 mLs by mouth at bedtime as needed. For cold symptoms.         . Pseudoephedrine-APAP-DM (DAYQUIL PO)   Oral   Take 30 mLs by mouth every 4 (four) hours as needed. For cold symptoms.           BP 137/88  Pulse 117  Temp(Src) 101.7 F (38.7 C) (Oral)  Resp 16  SpO2 100%  LMP 02/02/2013  Physical Exam  Nursing note and vitals reviewed. Constitutional: She is oriented to person, place, and time. She appears well-developed and well-nourished.  "Hot-potato voice"  HENT:  Head: Normocephalic and atraumatic.  Right Ear: Hearing, tympanic membrane and external ear normal.  Left Ear: Hearing, tympanic membrane and external ear normal.  Nose: No mucosal edema, rhinorrhea or sinus tenderness.  Mouth/Throat: Mucous membranes are normal. Edematous present. Oropharyngeal exudate, posterior oropharyngeal edema and posterior oropharyngeal erythema present.  Uvula deviates to left. Mild amount of tonsillar exudate.  Eyes: Pupils are equal, round, and reactive to light.  Neck:  Normal range of motion. Neck supple.  Cardiovascular: Normal rate, regular rhythm and normal heart sounds.  Exam reveals no gallop and no friction rub.   No murmur heard. Pulmonary/Chest: Effort normal and breath sounds normal. No stridor. No respiratory distress.  Lymphadenopathy:       Head (right side): Tonsillar adenopathy present.       Head (left side): Tonsillar adenopathy present.    She has cervical adenopathy.  Neurological: She is alert and oriented to person, place, and time.  Skin: Skin is warm and dry. No rash noted. No erythema.    ED Course  Procedures (including  critical care time)  Labs Reviewed  RAPID STREP SCREEN   I spoke with Dr. Lazarus Salines ENT, who advised to give high dose amoxicillin and have the patient followup in their office.  He advised to have her return here for any worsening in her condition. He advised that she did not necessarily need CT at this point. The patient has swelling on both R and L. The patient has more swelling on the R. There is a possible early peritonsillar abscess.   The patient has been seen by the attending Physician.  Patient is advised of the plan and told to return here for any worsening in her condition. Dr. Radford Pax advised to give 10mg  of Decadron IM.   MDM          Diana Dolly, PA-C 03/02/13 1440

## 2013-03-02 NOTE — ED Notes (Signed)
MD at bedside. 

## 2013-05-12 ENCOUNTER — Encounter: Payer: Self-pay | Admitting: Obstetrics

## 2013-05-12 ENCOUNTER — Telehealth: Payer: Self-pay | Admitting: *Deleted

## 2013-05-12 MED ORDER — FLUCONAZOLE 150 MG PO TABS: ORAL_TABLET | ORAL | Status: DC

## 2013-05-12 NOTE — Telephone Encounter (Signed)
Patient called and left voice message requesting a rx refill for yeast infection. To [facility] on [address]. Per Dr Clearance Coots okay patient seen within one year okay to treat. Rx sent to pharmacy.  Call placed to patient at [phone] she was informed pf rx to pharmacy.

## 2013-05-13 ENCOUNTER — Telehealth: Payer: Self-pay | Admitting: *Deleted

## 2013-05-13 MED ORDER — FLUCONAZOLE 150 MG PO TABS: ORAL_TABLET | ORAL | Status: DC

## 2013-05-13 NOTE — Telephone Encounter (Signed)
Pt requesting Diflucan 150 mg to be called into [facility] on [address] instead of [facility] [address].   Per pt's request, I called [facility] [address] and canceled order for Diflucan and sent new order e-scribe to [facility] on [address]. Pt aware.

## 2013-12-31 ENCOUNTER — Ambulatory Visit: Payer: Self-pay | Admitting: Obstetrics

## 2015-06-12 ENCOUNTER — Encounter (HOSPITAL_COMMUNITY): Payer: Self-pay

## 2015-06-12 ENCOUNTER — Emergency Department (HOSPITAL_COMMUNITY)
Admission: EM | Admit: 2015-06-12 | Discharge: 2015-06-12 | Disposition: A | Discharge status: Home / Self Care | Payer: 59 | Attending: Emergency Medicine | Admitting: Emergency Medicine

## 2015-06-12 DIAGNOSIS — Y9389 Activity, other specified: Secondary | ICD-10-CM | POA: Diagnosis not present

## 2015-06-12 DIAGNOSIS — Z720 Tobacco use: Secondary | ICD-10-CM | POA: Diagnosis not present

## 2015-06-12 DIAGNOSIS — Y998 Other external cause status: Secondary | ICD-10-CM | POA: Insufficient documentation

## 2015-06-12 DIAGNOSIS — Y92410 Unspecified street and highway as the place of occurrence of the external cause: Secondary | ICD-10-CM | POA: Diagnosis not present

## 2015-06-12 DIAGNOSIS — S3992XA Unspecified injury of lower back, initial encounter: Secondary | ICD-10-CM | POA: Diagnosis present

## 2015-06-12 DIAGNOSIS — S39012A Strain of muscle, fascia and tendon of lower back, initial encounter: Secondary | ICD-10-CM | POA: Insufficient documentation

## 2015-06-12 MED ORDER — OXYCODONE-ACETAMINOPHEN 5-325 MG PO TABS: 1.0000 | ORAL_TABLET | Freq: Four times a day (QID) | ORAL | Status: DC | PRN

## 2015-06-12 MED ORDER — CYCLOBENZAPRINE HCL 10 MG PO TABS: 10.0000 mg | ORAL_TABLET | Freq: Two times a day (BID) | ORAL | Status: DC | PRN

## 2015-06-12 NOTE — Discharge Instructions (Signed)
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
## 2015-06-12 NOTE — ED Notes (Signed)
Declined W/C at D/C and was escorted to lobby by RN. 

## 2015-06-12 NOTE — ED Notes (Signed)
No answer when pt called

## 2016-02-10 ENCOUNTER — Ambulatory Visit (INDEPENDENT_AMBULATORY_CARE_PROVIDER_SITE_OTHER): Payer: 59 | Admitting: Obstetrics

## 2016-02-10 ENCOUNTER — Encounter: Payer: Self-pay | Admitting: Obstetrics

## 2016-02-10 ENCOUNTER — Encounter: Payer: Self-pay | Admitting: *Deleted

## 2016-02-10 VITALS — BP 143/91 | HR 72 | Ht 67.0 in | Wt 217.0 lb

## 2016-02-10 DIAGNOSIS — N898 Other specified noninflammatory disorders of vagina: Secondary | ICD-10-CM

## 2016-02-10 DIAGNOSIS — Z01419 Encounter for gynecological examination (general) (routine) without abnormal findings: Secondary | ICD-10-CM

## 2016-02-10 DIAGNOSIS — Z3009 Encounter for other general counseling and advice on contraception: Secondary | ICD-10-CM

## 2016-02-10 MED ORDER — FLUCONAZOLE 150 MG PO TABS: 150.0000 mg | ORAL_TABLET | Freq: Once | ORAL | Status: DC

## 2016-02-10 MED ORDER — METRONIDAZOLE 500 MG PO TABS: 500.0000 mg | ORAL_TABLET | Freq: Two times a day (BID) | ORAL | Status: DC

## 2016-02-10 NOTE — Progress Notes (Signed)
Subjective:        Diana Mcbride is a 40 y.o. female here for a routine exam.  Current complaints: Malodorous vaginal discharge.    Personal health questionnaire:  Is patient Ashkenazi Jewish, have a family history of breast and/or ovarian cancer: no Is there a family history of uterine cancer diagnosed at age < 3650, gastrointestinal cancer, urinary tract cancer, family member who is a Personnel officerLynch syndrome-associated carrier: no Is the patient overweight and hypertensive, family history of diabetes, personal history of gestational diabetes, preeclampsia or PCOS: no Is patient over 6355, have PCOS,  family history of premature CHD under age 38, diabetes, smoke, have hypertension or peripheral artery disease:  no At any time, has a partner hit, kicked or otherwise hurt or frightened you?: no Over the past 2 weeks, have you felt down, depressed or hopeless?: no Over the past 2 weeks, have you felt little interest or pleasure in doing things?:no   Gynecologic History Patient's last menstrual period was 01/27/2016. Contraception: none Last Pap: >2 yrs.. Results were: normal Last mammogram: n/a. Results were: n/a  Obstetric History OB History  Gravida Para Term Preterm AB SAB TAB Ectopic Multiple Living  3 2 2  1     2     # Outcome Date GA Lbr Len/2nd Weight Sex Delivery Anes PTL Lv  3 Term 03/26/07    F    Y  2 AB 12/03/97          1 Term 11/18/96    Diana FerdinandM CS-LTranv   Y      History reviewed. No pertinent past medical history.  Past Surgical History  Procedure Laterality Date  . Cesarean section       Current outpatient prescriptions:  .  cyclobenzaprine (FLEXERIL) 10 MG tablet, Take 1 tablet (10 mg total) by mouth 2 (two) times daily as needed for muscle spasms. (Patient not taking: Reported on 02/10/2016), Disp: 10 tablet, Rfl: 0 .  fluconazole (DIFLUCAN) 150 MG tablet, Take 1 tablet (150 mg total) by mouth once., Disp: 1 tablet, Rfl: 2 .  metroNIDAZOLE (FLAGYL) 500 MG tablet, Take 1  tablet (500 mg total) by mouth 2 (two) times daily., Disp: 14 tablet, Rfl: 2 No Known Allergies  Social History  Substance Use Topics  . Smoking status: Former Games developermoker  . Smokeless tobacco: Not on file  . Alcohol Use: 0.0 oz/week    0 Standard drinks or equivalent per week     Comment: occasionally    Family History  Problem Relation Age of Onset  . Diabetes Mother   . Hypertension Mother   . Diabetes Father       Review of Systems  Constitutional: negative for fatigue and weight loss Respiratory: negative for cough and wheezing Cardiovascular: negative for chest pain, fatigue and palpitations Gastrointestinal: negative for abdominal pain and change in bowel habits Musculoskeletal:negative for myalgias Neurological: negative for gait problems and tremors Behavioral/Psych: negative for abusive relationship, depression Endocrine: negative for temperature intolerance   Genitourinary:negative for abnormal menstrual periods, genital lesions, hot flashes, sexual problems and vaginal discharge Integument/breast: negative for breast lump, breast tenderness, nipple discharge and skin lesion(s)    Objective:       BP 143/91 mmHg  Pulse 72  Ht 5\' 7"  (1.702 m)  Wt 217 lb (98.431 kg)  BMI 33.98 kg/m2  LMP 01/27/2016 General:   alert  Skin:   no rash or abnormalities  Lungs:   clear to auscultation bilaterally  Heart:   regular  rate and rhythm, S1, S2 normal, no murmur, click, rub or gallop  Breasts:   normal without suspicious masses, skin or nipple changes or axillary nodes  Abdomen:  normal findings: no organomegaly, soft, non-tender and no hernia  Pelvis:  External genitalia: normal general appearance Urinary system: urethral meatus normal and bladder without fullness, nontender Vaginal: normal without tenderness, induration or masses Cervix: normal appearance Adnexa: normal bimanual exam Uterus: anteverted and non-tender, normal size   Lab Review Urine pregnancy  test Labs reviewed yes Radiologic studies reviewed no    Assessment:    Healthy female exam.    Vaginal discharge  Contraceptive counseling and advice.  Does not want contraception.   Plan:   Flagyl Rx Diflucan Rx   Education reviewed: calcium supplements, low fat, low cholesterol diet and safe sex/STD prevention. Contraception: none. Follow up in: 1 year.   Meds ordered this encounter  Medications  . metroNIDAZOLE (FLAGYL) 500 MG tablet    Sig: Take 1 tablet (500 mg total) by mouth 2 (two) times daily.    Dispense:  14 tablet    Refill:  2  . fluconazole (DIFLUCAN) 150 MG tablet    Sig: Take 1 tablet (150 mg total) by mouth once.    Dispense:  1 tablet    Refill:  2   Orders Placed This Encounter  Procedures  . SureSwab, Vaginosis/Vaginitis Plus

## 2016-02-14 LAB — SURESWAB, VAGINOSIS/VAGINITIS PLUS
Atopobium vaginae: 6.5 Log (cells/mL)
C. GLABRATA, DNA: NOT DETECTED
C. TRACHOMATIS RNA, TMA: NOT DETECTED
C. TROPICALIS, DNA: NOT DETECTED
C. albicans, DNA: NOT DETECTED
C. parapsilosis, DNA: NOT DETECTED
GARDNERELLA VAGINALIS: 7.6 Log (cells/mL)
LACTOBACILLUS SPECIES: NOT DETECTED Log (cells/mL)
N. gonorrhoeae RNA, TMA: NOT DETECTED
T. vaginalis RNA, QL TMA: DETECTED — AB

## 2016-02-15 ENCOUNTER — Other Ambulatory Visit: Payer: Self-pay | Admitting: Obstetrics

## 2016-02-15 LAB — PAP, TP IMAGING W/ HPV RNA, RFLX HPV TYPE 16,18/45: HPV MRNA, HIGH RISK: NOT DETECTED

## 2016-03-07 ENCOUNTER — Other Ambulatory Visit: Payer: 59

## 2016-05-14 ENCOUNTER — Ambulatory Visit: Payer: 59 | Admitting: Obstetrics

## 2016-07-03 ENCOUNTER — Ambulatory Visit: Payer: Self-pay | Admitting: Obstetrics

## 2016-07-17 ENCOUNTER — Encounter: Payer: Self-pay | Admitting: Obstetrics

## 2016-07-17 ENCOUNTER — Ambulatory Visit (INDEPENDENT_AMBULATORY_CARE_PROVIDER_SITE_OTHER): Payer: 59 | Admitting: Obstetrics

## 2016-07-17 DIAGNOSIS — A499 Bacterial infection, unspecified: Secondary | ICD-10-CM

## 2016-07-17 DIAGNOSIS — N898 Other specified noninflammatory disorders of vagina: Secondary | ICD-10-CM | POA: Diagnosis not present

## 2016-07-17 DIAGNOSIS — N839 Noninflammatory disorder of ovary, fallopian tube and broad ligament, unspecified: Secondary | ICD-10-CM

## 2016-07-17 DIAGNOSIS — Z8619 Personal history of other infectious and parasitic diseases: Secondary | ICD-10-CM

## 2016-07-17 DIAGNOSIS — Z113 Encounter for screening for infections with a predominantly sexual mode of transmission: Secondary | ICD-10-CM

## 2016-07-17 DIAGNOSIS — B9689 Other specified bacterial agents as the cause of diseases classified elsewhere: Secondary | ICD-10-CM

## 2016-07-17 DIAGNOSIS — N760 Acute vaginitis: Secondary | ICD-10-CM

## 2016-07-17 MED ORDER — METRONIDAZOLE 500 MG PO TABS
500.0000 mg | ORAL_TABLET | Freq: Two times a day (BID) | ORAL | 2 refills | Status: DC
Start: 2016-07-17 — End: 2017-12-10

## 2016-07-17 NOTE — Patient Instructions (Signed)

## 2016-07-17 NOTE — Progress Notes (Signed)
Patient ID: Diana Mcbride, female   DOB: 1976/10/21, 40 y.o.   MRN: [id number]  No chief complaint on file.   HPI Diana Mcbride is a 40 y.o. female.  H/O trichomonas, treated.  Continues to have malodorous discharge.  Partner was also treated. HPI  History reviewed. No pertinent past medical history.  Past Surgical History:  Procedure Laterality Date  . CESAREAN SECTION      Family History  Problem Relation Age of Onset  . Diabetes Mother   . Hypertension Mother   . Diabetes Father     Social History Social History  Substance Use Topics  . Smoking status: Former Games developermoker  . Smokeless tobacco: Not on file  . Alcohol use 0.0 oz/week     Comment: occasionally    No Known Allergies  Current Outpatient Prescriptions  Medication Sig Dispense Refill  . lidocaine (XYLOCAINE) 2 % solution 5ml by mouth gargle and spit every 2 hours as needed    . acetaminophen (TYLENOL) 325 MG tablet Take by mouth.    . cyclobenzaprine (FLEXERIL) 10 MG tablet Take 1 tablet (10 mg total) by mouth 2 (two) times daily as needed for muscle spasms. (Patient not taking: Reported on 02/10/2016) 10 tablet 0  . fluconazole (DIFLUCAN) 150 MG tablet Take 1 tablet (150 mg total) by mouth once. 1 tablet 2  . metroNIDAZOLE (FLAGYL) 500 MG tablet Take 1 tablet (500 mg total) by mouth 2 (two) times daily. 14 tablet 2   No current facility-administered medications for this visit.     Review of Systems Review of Systems Constitutional: negative for fatigue and weight loss Respiratory: negative for cough and wheezing Cardiovascular: negative for chest pain, fatigue and palpitations Gastrointestinal: negative for abdominal pain and change in bowel habits Genitourinary:positive for malodorous vaginal discharge Integument/breast: negative for nipple discharge Musculoskeletal:negative for myalgias Neurological: negative for gait problems and tremors Behavioral/Psych: negative for abusive relationship,  depression Endocrine: negative for temperature intolerance     There were no vitals taken for this visit.  Physical Exam Physical Exam           General:  Alert and no distress Abdomen:  normal findings: no organomegaly, soft, non-tender and no hernia  Pelvis:  External genitalia: normal general appearance Urinary system: urethral meatus normal and bladder without fullness, nontender Vaginal: normal without tenderness, induration or masses Cervix: normal appearance Adnexa: normal bimanual exam Uterus: anteverted and non-tender, normal size      Data Reviewed Labs  Assessment     Vaginal discharge.  Probable BV. H/O trichomonas, treated    Plan    Wet prep and cultures done Flagyl Rx for 7 day treatment after each period x 4 F/U 8 months  Orders Placed This Encounter  Procedures  . Ambulatory referral to Endocrinology    Referral Priority:   Routine    Referral Type:   Consultation    Referral Reason:   Specialty Services Required    Number of Visits Requested:   1   Meds ordered this encounter  Medications  . metroNIDAZOLE (FLAGYL) 500 MG tablet    Sig: Take 1 tablet (500 mg total) by mouth 2 (two) times daily.    Dispense:  14 tablet    Refill:  2  . acetaminophen (TYLENOL) 325 MG tablet    Sig: Take by mouth.  . lidocaine (XYLOCAINE) 2 % solution    Sig: 5ml by mouth gargle and spit every 2 hours as needed

## 2016-07-20 LAB — NUSWAB VG+, CANDIDA 6SP
Atopobium vaginae: HIGH Score — AB
BVAB 2: HIGH {score} — AB
CANDIDA ALBICANS, NAA: NEGATIVE
CANDIDA PARAPSILOSIS, NAA: NEGATIVE
CHLAMYDIA TRACHOMATIS, NAA: NEGATIVE
Candida glabrata, NAA: NEGATIVE
Candida krusei, NAA: NEGATIVE
Candida lusitaniae, NAA: NEGATIVE
Candida tropicalis, NAA: NEGATIVE
MEGASPHAERA 1: HIGH {score} — AB
NEISSERIA GONORRHOEAE, NAA: NEGATIVE
TRICH VAG BY NAA: NEGATIVE

## 2016-07-22 ENCOUNTER — Other Ambulatory Visit: Payer: Self-pay | Admitting: Obstetrics

## 2016-08-21 ENCOUNTER — Encounter: Payer: Self-pay | Admitting: Endocrinology

## 2016-09-18 ENCOUNTER — Ambulatory Visit (INDEPENDENT_AMBULATORY_CARE_PROVIDER_SITE_OTHER): Payer: 59 | Admitting: Endocrinology

## 2016-09-18 ENCOUNTER — Encounter: Payer: Self-pay | Admitting: Endocrinology

## 2016-09-18 DIAGNOSIS — L680 Hirsutism: Secondary | ICD-10-CM

## 2016-09-18 DIAGNOSIS — R635 Abnormal weight gain: Secondary | ICD-10-CM

## 2016-09-18 HISTORY — DX: Hirsutism: L68.0

## 2016-09-18 MED ORDER — DEXAMETHASONE 1 MG PO TABS: ORAL_TABLET | ORAL | 0 refills | Status: AC

## 2016-09-18 NOTE — Patient Instructions (Addendum)
You should do a "dexamethasone suppression test."  For this, you would take dexamethasone 1 mg at 9-10 pm (I have sent a prescription to your pharmacy), then come in for a "cortisol" blood test the next morning before 9 am.  You do not need to be fasting for this test. Based on the results, I may be able to prescribe for you a pill to help get the pregnancy going.

## 2016-09-18 NOTE — Progress Notes (Signed)
Subjective:    Patient ID: Diana Mcbride, female    DOB: 1976-02-13, 40 y.o.   MRN: [id number]  HPI Pt is ref by Dr Clearance Coots, for infertility.  She reports she had menarche at age 49.  She has always had regular menses. She is G3P2.  She has slight terminal hair on the face, and assoc weight gain.  She has been trying to start a pregnancy for the past 3 years.  She has never been on hormonal contraceptives.   Past Medical History:  Diagnosis Date  . Hirsutism 09/18/2016    Past Surgical History:  Procedure Laterality Date  . CESAREAN SECTION      Social History   Social History  . Marital status: Married    Spouse name: N/A  . Number of children: N/A  . Years of education: N/A   Occupational History  . Not on file.   Social History Main Topics  . Smoking status: Former Games developer  . Smokeless tobacco: Not on file  . Alcohol use 0.0 oz/week     Comment: occasionally  . Drug use: No     Comment: rarely  . Sexual activity: Yes    Birth control/ protection: None   Other Topics Concern  . Not on file   Social History Narrative  . No narrative on file    Current Outpatient Prescriptions on File Prior to Visit  Medication Sig Dispense Refill  . metroNIDAZOLE (FLAGYL) 500 MG tablet Take 1 tablet (500 mg total) by mouth 2 (two) times daily. 14 tablet 2   No current facility-administered medications on file prior to visit.     No Known Allergies  Family History  Problem Relation Age of Onset  . Diabetes Mother   . Hypertension Mother   . Diabetes Father   . Infertility Neg Hx     BP 128/82   Pulse (!) 57   Ht 5\' 7"  (1.702 m)   Wt 221 lb (100.2 kg)   BMI 34.61 kg/m    Review of Systems  Constitutional: Negative for diaphoresis.  HENT: Negative for voice change.   Eyes: Negative for visual disturbance.  Respiratory: Negative for shortness of breath.   Cardiovascular: Negative for leg swelling.  Gastrointestinal: Negative for nausea.  Endocrine: Negative  for cold intolerance.  Genitourinary: Negative for frequency.  Musculoskeletal: Negative for gait problem.  Skin: Negative for rash.  Allergic/Immunologic: Negative for environmental allergies.  Neurological: Negative for headaches.  Hematological: Does not bruise/bleed easily.  Psychiatric/Behavioral: Negative for dysphoric mood.       Objective:   Physical Exam VS: see vs page GEN: no distress HEAD: head: no deformity eyes: no periorbital swelling, no proptosis external nose and ears are normal mouth: no lesion seen NECK: supple, thyroid is not enlarged CHEST WALL: no deformity LUNGS: clear to auscultation CV: reg rate and rhythm, no murmur ABD: abdomen is soft, nontender.  no hepatosplenomegaly.  not distended.  no hernia.  No striae MUSCULOSKELETAL: muscle bulk and strength are grossly normal.  no obvious joint swelling.  gait is normal and steady EXTEMITIES: no deformity.  no ulcer on the feet.  feet are of normal color and temp.  no edema PULSES: dorsalis pedis intact bilat.  no carotid bruit NEURO:  cn 2-12 grossly intact.   readily moves all 4's.  sensation is intact to touch on the feet.  SKIN:  Normal texture and temperature.  No rash or suspicious lesion is visible.  Moderate terminal hair on the face  and abdomen.   NODES:  None palpable at the neck.  PSYCH: alert, well-oriented.  Does not appear anxious nor depressed.    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   Infertility, new to me, uncertain etiology. Depending on results, metformin may help.   Weight gain: she should exclude Cushing's.    Patient is advised the following: Patient Instructions  You should do a "dexamethasone suppression test."  For this, you would take dexamethasone 1 mg at 9-10 pm (I have sent a prescription to your pharmacy), then come in for a "cortisol" blood  test the next morning before 9 am.  You do not need to be fasting for this test. Based on the results, I may be able to prescribe for you a pill to help get the pregnancy going.

## 2016-09-21 ENCOUNTER — Other Ambulatory Visit (INDEPENDENT_AMBULATORY_CARE_PROVIDER_SITE_OTHER): Payer: 59

## 2016-09-21 DIAGNOSIS — R635 Abnormal weight gain: Secondary | ICD-10-CM

## 2016-09-21 DIAGNOSIS — L680 Hirsutism: Secondary | ICD-10-CM

## 2016-09-21 LAB — BASIC METABOLIC PANEL
BUN: 12 mg/dL (ref 6–23)
CHLORIDE: 105 meq/L (ref 96–112)
CO2: 27 mEq/L (ref 19–32)
CREATININE: 0.78 mg/dL (ref 0.40–1.20)
Calcium: 9.8 mg/dL (ref 8.4–10.5)
GFR: 106.39 mL/min (ref >60.00)
GLUCOSE: 107 mg/dL — AB (ref 70–99)
POTASSIUM: 4.2 meq/L (ref 3.5–5.1)
Sodium: 137 mEq/L (ref 135–145)

## 2016-09-21 LAB — CORTISOL: Cortisol, Plasma: 0.5 ug/dL

## 2016-09-21 LAB — FOLLICLE STIMULATING HORMONE: FSH: 8.1 m[IU]/mL

## 2016-09-21 LAB — TSH: TSH: 0.28 u[IU]/mL — ABNORMAL LOW (ref 0.35–4.50)

## 2016-09-21 LAB — LUTEINIZING HORMONE: LH: 11.7 m[IU]/mL

## 2016-09-22 LAB — DHEA-SULFATE: DHEA-SO4: 49 ug/dL (ref 23–266)

## 2016-09-22 LAB — TESTOSTERONE,FREE AND TOTAL
TESTOSTERONE FREE: 0.5 pg/mL (ref 0.0–4.2)
TESTOSTERONE: 12 ng/dL (ref 8–48)

## 2016-09-22 LAB — PROLACTIN: PROLACTIN: 6.5 ng/mL

## 2016-10-01 ENCOUNTER — Telehealth: Payer: Self-pay

## 2016-10-01 NOTE — Telephone Encounter (Signed)
We are still waiting for estradiol.  Please ask lab how long this will be

## 2016-10-01 NOTE — Telephone Encounter (Signed)
Patient called requesting the results from her blood tests from 09/18/2016. Patient is on mychart at this time.

## 2016-10-02 ENCOUNTER — Other Ambulatory Visit: Payer: Self-pay

## 2016-10-02 LAB — ESTRADIOL, FREE
ESTRADIOL FREE: 1.17 pg/mL
ESTRADIOL: 54 pg/mL

## 2016-10-02 NOTE — Telephone Encounter (Signed)
I contacted the patient and advised of this message. Patient voiced understanding. I contacted the lab about the result for the estradiol. The lab is going get back with us on the result.

## 2016-10-19 ENCOUNTER — Telehealth: Payer: Self-pay | Admitting: Endocrinology

## 2016-10-19 NOTE — Telephone Encounter (Signed)
I contacted the patient and advised of message via voicemail. Requested a call back from the patient if she would like to discuss further. 

## 2016-10-19 NOTE — Telephone Encounter (Signed)
[facility] is calling for the result of lab

## 2016-10-19 NOTE — Telephone Encounter (Signed)
See message and please advise, Thanks!  

## 2016-10-19 NOTE — Telephone Encounter (Signed)
Aside from the thyroid being slightly off, all are normal. All you need to do for the thyroid is to recheck in the future, which I would be happy to do. There is a pill that gets the ovulation going. However, you said your menstrual periods we pretty regular, so I don't think this would help. Your next step would be to see a fertility specialist.

## 2017-01-23 ENCOUNTER — Telehealth: Payer: Self-pay

## 2017-01-23 DIAGNOSIS — B379 Candidiasis, unspecified: Secondary | ICD-10-CM

## 2017-01-23 MED ORDER — FLUCONAZOLE 150 MG PO TABS: 150.0000 mg | ORAL_TABLET | Freq: Once | ORAL | 0 refills | Status: AC

## 2017-01-23 NOTE — Telephone Encounter (Signed)
Returned call, patient stated that she has been having vaginal itching since taking Flagyl and is requesting rx for yeast infection. Sent rx to pharmacy with provider approval.

## 2017-02-13 ENCOUNTER — Ambulatory Visit: Payer: Self-pay | Admitting: Obstetrics

## 2017-03-14 ENCOUNTER — Ambulatory Visit (INDEPENDENT_AMBULATORY_CARE_PROVIDER_SITE_OTHER): Payer: BLUE CROSS/BLUE SHIELD | Admitting: Obstetrics

## 2017-03-14 ENCOUNTER — Encounter: Payer: Self-pay | Admitting: Obstetrics

## 2017-03-14 VITALS — BP 133/93 | HR 90 | Temp 97.5°F | Ht 67.0 in | Wt 220.0 lb

## 2017-03-14 DIAGNOSIS — R399 Unspecified symptoms and signs involving the genitourinary system: Secondary | ICD-10-CM

## 2017-03-14 DIAGNOSIS — B373 Candidiasis of vulva and vagina: Secondary | ICD-10-CM | POA: Diagnosis not present

## 2017-03-14 DIAGNOSIS — Z01419 Encounter for gynecological examination (general) (routine) without abnormal findings: Secondary | ICD-10-CM

## 2017-03-14 DIAGNOSIS — N898 Other specified noninflammatory disorders of vagina: Secondary | ICD-10-CM | POA: Diagnosis not present

## 2017-03-14 DIAGNOSIS — B3731 Acute candidiasis of vulva and vagina: Secondary | ICD-10-CM

## 2017-03-14 MED ORDER — FLUCONAZOLE 200 MG PO TABS: 200.0000 mg | ORAL_TABLET | Freq: Every day | ORAL | 2 refills | Status: DC

## 2017-03-14 MED ORDER — NITROFURANTOIN MONOHYD MACRO 100 MG PO CAPS: 100.0000 mg | ORAL_CAPSULE | Freq: Two times a day (BID) | ORAL | 2 refills | Status: DC

## 2017-03-14 NOTE — Progress Notes (Signed)
Patient presents for annual exam

## 2017-03-14 NOTE — Progress Notes (Addendum)
Subjective:        Diana Mcbride is a 41 y.o. female here for a routine exam.  Current complaints: No period for March.  Has vaginal itching and discharge.  Also has urinary frequency and can't hold full bladder.  Personal health questionnaire:  Is patient Ashkenazi Jewish, have a family history of breast and/or ovarian cancer: no Is there a family history of uterine cancer diagnosed at age < 76, gastrointestinal cancer, urinary tract cancer, family member who is a Personnel officer syndrome-associated carrier: no Is the patient overweight and hypertensive, family history of diabetes, personal history of gestational diabetes, preeclampsia or PCOS: no Is patient over 2, have PCOS,  family history of premature CHD under age 51, diabetes, smoke, have hypertension or peripheral artery disease:  no At any time, has a partner hit, kicked or otherwise hurt or frightened you?: no Over the past 2 weeks, have you felt down, depressed or hopeless?: no Over the past 2 weeks, have you felt little interest or pleasure in doing things?:no   Gynecologic History Patient's last menstrual period was 01/05/2017 (exact date). Contraception: none Last Pap: 2017. Results were: normal Last mammogram: n/a. Results were: n/a  Obstetric History OB History  Gravida Para Term Preterm AB Living  SAB TAB Ectopic Multiple Live Births          2    # Outcome Date GA Lbr Len/2nd Weight Sex Delivery Anes PTL Lv  3 Term 03/26/07    F    LIV  2 AB 12/03/97          1 Term 11/18/96    Diana Mcbride   LIV      Past Medical History:  Diagnosis Date  . Hirsutism 09/18/2016    Past Surgical History:  Procedure Laterality Date  . CESAREAN SECTION       Current Outpatient Prescriptions:  .  dexamethasone (DECADRON) 1 MG tablet, Take at 9-10 pm, the night before blood test. (Patient not taking: Reported on 03/14/2017), Disp: 1 tablet, Rfl: 0 .  fluconazole (DIFLUCAN) 200 MG tablet, Take 1 tablet (200 mg  total) by mouth daily., Disp: 3 tablet, Rfl: 2 .  metroNIDAZOLE (FLAGYL) 500 MG tablet, Take 1 tablet (500 mg total) by mouth 2 (two) times daily. (Patient not taking: Reported on 03/14/2017), Disp: 14 tablet, Rfl: 2 .  nitrofurantoin, macrocrystal-monohydrate, (MACROBID) 100 MG capsule, Take 1 capsule (100 mg total) by mouth 2 (two) times daily., Disp: 14 capsule, Rfl: 2 No Known Allergies  Social History  Substance Use Topics  . Smoking status: Former Games developer  . Smokeless tobacco: Never Used  . Alcohol use 0.0 oz/week     Comment: occasionally    Family History  Problem Relation Age of Onset  . Diabetes Mother   . Hypertension Mother   . Diabetes Father   . Infertility Neg Hx       Review of Systems  Constitutional: negative for fatigue and weight loss Respiratory: negative for cough and wheezing Cardiovascular: negative for chest pain, fatigue and palpitations Gastrointestinal: negative for abdominal pain and change in bowel habits Musculoskeletal:negative for myalgias Neurological: negative for gait problems and tremors Behavioral/Psych: negative for abusive relationship, depression Endocrine: negative for temperature intolerance    Genitourinary:positive for abnormal menstrual periods  and vaginal discharge.  Negative for genital lesions, hot flashes, sexual problems.  Positive for urinary frequency and urge incontinence Integument/breast: negative for breast lump, breast tenderness, nipple discharge and  skin lesion(s)    Objective:       BP (!) 133/93   Pulse 90   Temp 97.5 F (36.4 C)   Ht  (1.702 m)   Wt 220 lb (99.8 kg)   LMP 01/05/2017 (Exact Date)   BMI 34.46 kg/m  General:   alert  Skin:   no rash or abnormalities  Lungs:   clear to auscultation bilaterally  Heart:   regular rate and rhythm, S1, S2 normal, no murmur, click, rub or gallop  Breasts:   normal without suspicious masses, skin or nipple changes or axillary nodes  Abdomen:  normal findings:  no organomegaly, soft, non-tender and no hernia  Pelvis:  External genitalia: normal general appearance Urinary system: urethral meatus normal and bladder without fullness, nontender Vaginal: normal without tenderness, induration or masses Cervix: normal appearance Adnexa: normal bimanual exam Uterus: anteverted and non-tender, normal size   Lab Review Urine pregnancy test Labs reviewed yes Radiologic studies reviewed no  50% of 20 min visit spent on counseling and coordination of care.    Assessment:    Healthy female exam.    Candida vaginitis  UTI symptoms   Plan:   Diflucan Rx for yeast Macrobid Rx for UTI symptoms and urine culture sent   Education reviewed: calcium supplements, depression evaluation, low fat, low cholesterol diet, safe sex/STD prevention, self breast exams and weight bearing exercise. Contraception: none. Follow up in: 1 year.   Meds ordered this encounter  Medications  . fluconazole (DIFLUCAN) 200 MG tablet    Sig: Take 1 tablet (200 mg total) by mouth daily.    Dispense:  3 tablet    Refill:  2  . nitrofurantoin, macrocrystal-monohydrate, (MACROBID) 100 MG capsule    Sig: Take 1 capsule (100 mg total) by mouth 2 (two) times daily.    Dispense:  14 capsule    Refill:  2   Orders Placed This Encounter  Procedures  . Urine culture     Patient ID: Diana Mcbride, female   DOB: 04-Jul-1976, 41 y.o.   MRN: [id number]

## 2017-03-15 LAB — CERVICOVAGINAL ANCILLARY ONLY
Bacterial vaginitis: NEGATIVE
CANDIDA VAGINITIS: POSITIVE — AB
CHLAMYDIA, DNA PROBE: NEGATIVE
NEISSERIA GONORRHEA: NEGATIVE
TRICH (WINDOWPATH): NEGATIVE

## 2017-03-16 ENCOUNTER — Other Ambulatory Visit: Payer: Self-pay | Admitting: Obstetrics

## 2017-03-16 LAB — URINE CULTURE

## 2017-03-19 LAB — CYTOLOGY - PAP
Diagnosis: NEGATIVE
HPV (WINDOPATH): NOT DETECTED

## 2017-12-10 ENCOUNTER — Ambulatory Visit (INDEPENDENT_AMBULATORY_CARE_PROVIDER_SITE_OTHER): Payer: BLUE CROSS/BLUE SHIELD | Admitting: Obstetrics

## 2017-12-10 ENCOUNTER — Encounter: Payer: Self-pay | Admitting: Obstetrics

## 2017-12-10 VITALS — BP 133/95 | HR 63 | Ht 67.0 in | Wt 219.0 lb

## 2017-12-10 DIAGNOSIS — N839 Noninflammatory disorder of ovary, fallopian tube and broad ligament, unspecified: Secondary | ICD-10-CM | POA: Diagnosis not present

## 2017-12-10 DIAGNOSIS — N898 Other specified noninflammatory disorders of vagina: Secondary | ICD-10-CM

## 2017-12-10 DIAGNOSIS — Z113 Encounter for screening for infections with a predominantly sexual mode of transmission: Secondary | ICD-10-CM

## 2017-12-10 DIAGNOSIS — N760 Acute vaginitis: Secondary | ICD-10-CM

## 2017-12-10 DIAGNOSIS — E669 Obesity, unspecified: Secondary | ICD-10-CM | POA: Diagnosis not present

## 2017-12-10 DIAGNOSIS — Z3169 Encounter for other general counseling and advice on procreation: Secondary | ICD-10-CM

## 2017-12-10 DIAGNOSIS — B9689 Other specified bacterial agents as the cause of diseases classified elsewhere: Secondary | ICD-10-CM | POA: Diagnosis not present

## 2017-12-10 MED ORDER — METRONIDAZOLE 500 MG PO TABS: 500.0000 mg | ORAL_TABLET | Freq: Two times a day (BID) | ORAL | 2 refills | Status: DC

## 2017-12-10 MED ORDER — DUET DHA BALANCED 25-1 & 267 MG PO MISC: 2.0000 | Freq: Every day | ORAL | 11 refills | Status: DC

## 2017-12-10 NOTE — Progress Notes (Signed)
Malodorous discharge x 4 weeks, happened after anal sex.  Denies NV, fever, chills.

## 2017-12-10 NOTE — Progress Notes (Signed)
Patient ID: Diana Mcbride, female   DOB: 06-18-1976, 42 y.o.   MRN: [id number]  Chief Complaint  Patient presents with  . Vaginal Discharge    HPI Diana Mcbride is a 42 y.o. female.  Malodorous vaginal discharge.  Happened after anal intercourse. HPI  Past Medical History:  Diagnosis Date  . Hirsutism 09/18/2016    Past Surgical History:  Procedure Laterality Date  . CESAREAN SECTION      Family History  Problem Relation Age of Onset  . Diabetes Mother   . Hypertension Mother   . Diabetes Father   . Infertility Neg Hx     Social History Social History   Tobacco Use  . Smoking status: Former Games developer  . Smokeless tobacco: Never Used  Substance Use Topics  . Alcohol use: Yes    Alcohol/week: 0.0 oz    Comment: occasionally  . Drug use: No    Comment: rarely    No Known Allergies  Current Outpatient Medications  Medication Sig Dispense Refill  . dexamethasone (DECADRON) 1 MG tablet Take at 9-10 pm, the night before blood test. (Patient not taking: Reported on 03/14/2017) 1 tablet 0  . fluconazole (DIFLUCAN) 200 MG tablet Take 1 tablet (200 mg total) by mouth daily. (Patient not taking: Reported on 12/10/2017) 3 tablet 2  . metroNIDAZOLE (FLAGYL) 500 MG tablet Take 1 tablet (500 mg total) by mouth 2 (two) times daily. 14 tablet 2  . nitrofurantoin, macrocrystal-monohydrate, (MACROBID) 100 MG capsule Take 1 capsule (100 mg total) by mouth 2 (two) times daily. (Patient not taking: Reported on 12/10/2017) 14 capsule 2  . Prenat-FePoly-Fered-FA-Omega 3 (DUET DHA BALANCED) 25-1 & 267 MG MISC Take 2 tablets by mouth daily before breakfast. 30 each 11   No current facility-administered medications for this visit.     Review of Systems Review of Systems Constitutional: negative for fatigue and weight loss Respiratory: negative for cough and wheezing Cardiovascular: negative for chest pain, fatigue and palpitations Gastrointestinal: negative for abdominal pain and change in  bowel habits Genitourinary:malodorous vaginal discharge Integument/breast: negative for nipple discharge Musculoskeletal:negative for myalgias Neurological: negative for gait problems and tremors Behavioral/Psych: negative for abusive relationship, depression Endocrine: negative for temperature intolerance      Blood pressure (!) 133/95, pulse 63, height 5\' 7"  (1.702 m), weight 219 lb (99.3 kg), last menstrual period 11/29/2017.  Physical Exam Physical Exam           General:  Alert and no distress Abdomen:  normal findings: no organomegaly, soft, non-tender and no hernia  Pelvis:  External genitalia: normal general appearance Urinary system: urethral meatus normal and bladder without fullness, nontender Vaginal: normal without tenderness, induration or masses Cervix: normal appearance Adnexa: normal bimanual exam Uterus: anteverted and non-tender, normal size    50% of 15 min visit spent on counseling and coordination of care.   Data Reviewed Labs  Assessment and Plan:    1. Vaginal discharge Rx: - Cervicovaginal ancillary only  2. BV (bacterial vaginosis) Rx: - metroNIDAZOLE (FLAGYL) 500 MG tablet; Take 1 tablet (500 mg total) by mouth 2 (two) times daily.  Dispense: 14 tablet; Refill: 2  3. Disorder of ovulation Rx: - Ambulatory referral to Endocrinology  4. Encounter for preconception consultation Rx: - Prenat-FePoly-Fered-FA-Omega 3 (DUET DHA BALANCED) 25-1 & 267 MG MISC; Take 2 tablets by mouth daily before breakfast.  Dispense: 30 each; Refill: 11  5. Obesity (BMI 30.0-34.9) - program of caloric reduction, exercise and behavioral modification recommended    Plan  Follow up in 6 months  Orders Placed This Encounter  Procedures  . Ambulatory referral to Endocrinology    Referral Priority:   Routine    Referral Type:   Consultation    Referral Reason:   Specialty Services Required    Requested Specialty:   Endocrinology    Number of Visits Requested:    1   Meds ordered this encounter  Medications  . metroNIDAZOLE (FLAGYL) 500 MG tablet    Sig: Take 1 tablet (500 mg total) by mouth 2 (two) times daily.    Dispense:  14 tablet    Refill:  2  . Prenat-FePoly-Fered-FA-Omega 3 (DUET DHA BALANCED) 25-1 & 267 MG MISC    Sig: Take 2 tablets by mouth daily before breakfast.    Dispense:  30 each    Refill:  11

## 2017-12-11 ENCOUNTER — Other Ambulatory Visit: Payer: Self-pay | Admitting: Obstetrics

## 2017-12-11 DIAGNOSIS — B3731 Acute candidiasis of vulva and vagina: Secondary | ICD-10-CM

## 2017-12-11 DIAGNOSIS — B373 Candidiasis of vulva and vagina: Secondary | ICD-10-CM

## 2017-12-11 LAB — CERVICOVAGINAL ANCILLARY ONLY
BACTERIAL VAGINITIS: POSITIVE — AB
CANDIDA VAGINITIS: POSITIVE — AB
CHLAMYDIA, DNA PROBE: NEGATIVE
NEISSERIA GONORRHEA: NEGATIVE
Trichomonas: NEGATIVE

## 2017-12-11 MED ORDER — FLUCONAZOLE 200 MG PO TABS: 200.0000 mg | ORAL_TABLET | Freq: Every day | ORAL | 2 refills | Status: DC

## 2018-06-09 ENCOUNTER — Encounter: Payer: Self-pay | Admitting: Obstetrics

## 2018-06-09 ENCOUNTER — Ambulatory Visit (INDEPENDENT_AMBULATORY_CARE_PROVIDER_SITE_OTHER): Payer: BLUE CROSS/BLUE SHIELD | Admitting: Obstetrics

## 2018-06-09 VITALS — BP 133/88 | HR 73 | Ht 67.0 in | Wt 225.2 lb

## 2018-06-09 DIAGNOSIS — N898 Other specified noninflammatory disorders of vagina: Secondary | ICD-10-CM | POA: Diagnosis not present

## 2018-06-09 DIAGNOSIS — Z1151 Encounter for screening for human papillomavirus (HPV): Secondary | ICD-10-CM

## 2018-06-09 DIAGNOSIS — Z01419 Encounter for gynecological examination (general) (routine) without abnormal findings: Secondary | ICD-10-CM | POA: Diagnosis not present

## 2018-06-09 DIAGNOSIS — Z124 Encounter for screening for malignant neoplasm of cervix: Secondary | ICD-10-CM

## 2018-06-09 DIAGNOSIS — Z113 Encounter for screening for infections with a predominantly sexual mode of transmission: Secondary | ICD-10-CM

## 2018-06-09 DIAGNOSIS — E669 Obesity, unspecified: Secondary | ICD-10-CM

## 2018-06-09 MED ORDER — TINIDAZOLE 500 MG PO TABS: 1000.0000 mg | ORAL_TABLET | Freq: Every day | ORAL | 2 refills | Status: DC

## 2018-06-09 NOTE — Progress Notes (Signed)
Pt presents for annual and all STD testing today. Pt c/o recurrent BV and yeast infections.

## 2018-06-09 NOTE — Progress Notes (Signed)
Subjective:        Diana Mcbride is a 42 y.o. female here for a routine exam.  Current complaints: Irregular periods..    Personal health questionnaire:  Is patient Ashkenazi Jewish, have a family history of breast and/or ovarian cancer: no Is there a family history of uterine cancer diagnosed at age < 3650, gastrointestinal cancer, urinary tract cancer, family member who is a Personnel officerLynch syndrome-associated carrier: no Is the patient overweight and hypertensive, family history of diabetes, personal history of gestational diabetes, preeclampsia or PCOS: no Is patient over 6955, have PCOS,  family history of premature CHD under age 40, diabetes, smoke, have hypertension or peripheral artery disease:  no At any time, has a partner hit, kicked or otherwise hurt or frightened you?: no Over the past 2 weeks, have you felt down, depressed or hopeless?: no Over the past 2 weeks, have you felt little interest or pleasure in doing things?:no   Gynecologic History Patient's last menstrual period was 05/12/2018. Contraception: none Last Pap: 2018. Results were: normal Last mammogram: n/a. Results were: n/a  Obstetric History OB History  Gravida Para Term Preterm AB Living  3 2 2   1 2   SAB TAB Ectopic Multiple Live Births          2    # Outcome Date GA Lbr Len/2nd Weight Sex Delivery Anes PTL Lv  3 Term 03/26/07    F    LIV  2 AB 12/03/97          1 Term 11/18/96    Wandalee FerdinandM [id number]   LIV    Past Medical History:  Diagnosis Date  . Hirsutism 09/18/2016    Past Surgical History:  Procedure Laterality Date  . CESAREAN SECTION       Current Outpatient Medications:  .  dexamethasone (DECADRON) 1 MG tablet, Take at 9-10 pm, the night before blood test. (Patient not taking: Reported on 03/14/2017), Disp: 1 tablet, Rfl: 0 .  fluconazole (DIFLUCAN) 200 MG tablet, Take 1 tablet (200 mg total) by mouth daily., Disp: 3 tablet, Rfl: 2 .  metroNIDAZOLE (FLAGYL) 500 MG tablet, Take 1 tablet (500 mg  total) by mouth 2 (two) times daily., Disp: 14 tablet, Rfl: 2 .  nitrofurantoin, macrocrystal-monohydrate, (MACROBID) 100 MG capsule, Take 1 capsule (100 mg total) by mouth 2 (two) times daily. (Patient not taking: Reported on 12/10/2017), Disp: 14 capsule, Rfl: 2 .  Prenat-FePoly-Fered-FA-Omega 3 (DUET DHA BALANCED) 25-1 & 267 MG MISC, Take 2 tablets by mouth daily before breakfast., Disp: 30 each, Rfl: 11 .  tinidazole (TINDAMAX) 500 MG tablet, Take 2 tablets (1,000 mg total) by mouth daily with breakfast., Disp: 10 tablet, Rfl: 2 No Known Allergies  Social History   Tobacco Use  . Smoking status: Former Games developermoker  . Smokeless tobacco: Never Used  Substance Use Topics  . Alcohol use: Yes    Alcohol/week: 0.0 oz    Comment: occasionally    Family History  Problem Relation Age of Onset  . Diabetes Mother   . Hypertension Mother   . Diabetes Father   . Cancer Paternal Grandmother        Unsure which per pt   . Infertility Neg Hx       Review of Systems  Constitutional: negative for fatigue and weight loss Respiratory: negative for cough and wheezing Cardiovascular: negative for chest pain, fatigue and palpitations Gastrointestinal: negative for abdominal pain and change in bowel habits Musculoskeletal:negative for myalgias Neurological: negative for gait  problems and tremors Behavioral/Psych: negative for abusive relationship, depression Endocrine: negative for temperature intolerance    Genitourinary:POSITIVE for irregular periods Integument/breast: negative for breast lump, breast tenderness, nipple discharge and skin lesion(s)    Objective:       BP 133/88   Pulse 73   Ht 5\' 7"  (1.702 m)   Wt 225 lb 3.2 oz (102.2 kg)   LMP 05/12/2018   BMI 35.27 kg/m  General:   alert  Skin:   no rash or abnormalities  Lungs:   clear to auscultation bilaterally  Heart:   regular rate and rhythm, S1, S2 normal, no murmur, click, rub or gallop  Breasts:   normal without suspicious  masses, skin or nipple changes or axillary nodes  Abdomen:  normal findings: no organomegaly, soft, non-tender and no hernia  Pelvis:  External genitalia: normal general appearance Urinary system: urethral meatus normal and bladder without fullness, nontender Vaginal: normal without tenderness, induration or masses Cervix: normal appearance Adnexa: normal bimanual exam Uterus: anteverted and non-tender, normal size   Lab Review Urine pregnancy test Labs reviewed yes Radiologic studies reviewed yes  50% of 20 min visit spent on counseling and coordination of care.   Assessment:     1. Encounter for gynecological examination with Papanicolaou smear of cervix Rx: - Cytology - PAP  2. Obesity (BMI 30.0-34.9) - program of caloric reduction, exercise and behavioral modification recommended  3. Vaginal discharge Rx: - tinidazole (TINDAMAX) 500 MG tablet; Take 2 tablets (1,000 mg total) by mouth daily with breakfast.  Dispense: 10 tablet; Refill: 2 - Cervicovaginal ancillary only  4. Screening examination for STD (sexually transmitted disease) Rx: - Hepatitis B surface antigen - Hepatitis C antibody - HIV antibody - RPR   Plan:    Education reviewed: calcium supplements, depression evaluation, low fat, low cholesterol diet, safe sex/STD prevention, self breast exams and weight bearing exercise. Contraception: none. Follow up in: 1 year.   Meds ordered this encounter  Medications  . tinidazole (TINDAMAX) 500 MG tablet    Sig: Take 2 tablets (1,000 mg total) by mouth daily with breakfast.    Dispense:  10 tablet    Refill:  2   Orders Placed This Encounter  Procedures  . Hepatitis B surface antigen  . Hepatitis C antibody  . HIV antibody  . RPR     Brock Bad MD 06-09-2018

## 2018-06-10 ENCOUNTER — Telehealth: Payer: Self-pay

## 2018-06-10 ENCOUNTER — Other Ambulatory Visit: Payer: Self-pay | Admitting: Obstetrics

## 2018-06-10 DIAGNOSIS — B373 Candidiasis of vulva and vagina: Secondary | ICD-10-CM

## 2018-06-10 DIAGNOSIS — B3731 Acute candidiasis of vulva and vagina: Secondary | ICD-10-CM

## 2018-06-10 LAB — CYTOLOGY - PAP
Diagnosis: NEGATIVE
HPV (WINDOPATH): NOT DETECTED

## 2018-06-10 LAB — HEPATITIS B SURFACE ANTIGEN: Hepatitis B Surface Ag: NEGATIVE

## 2018-06-10 LAB — CERVICOVAGINAL ANCILLARY ONLY
BACTERIAL VAGINITIS: POSITIVE — AB
Candida vaginitis: POSITIVE — AB
Chlamydia: NEGATIVE
Neisseria Gonorrhea: NEGATIVE
TRICH (WINDOWPATH): NEGATIVE

## 2018-06-10 LAB — RPR: RPR Ser Ql: NONREACTIVE

## 2018-06-10 LAB — HEPATITIS C ANTIBODY

## 2018-06-10 LAB — HIV ANTIBODY (ROUTINE TESTING W REFLEX): HIV SCREEN 4TH GENERATION: NONREACTIVE

## 2018-06-10 MED ORDER — FLUCONAZOLE 200 MG PO TABS: ORAL_TABLET | ORAL | 2 refills | Status: DC

## 2018-06-10 NOTE — Telephone Encounter (Signed)
-----   Message from Brock Badharles A Harper, MD sent at 06/10/2018  4:24 PM EDT ----- Diflucan Rx for yeast

## 2018-06-10 NOTE — Telephone Encounter (Signed)
Left VM to pick up RX 

## 2019-08-13 ENCOUNTER — Other Ambulatory Visit: Payer: Self-pay

## 2019-08-13 DIAGNOSIS — N760 Acute vaginitis: Secondary | ICD-10-CM

## 2019-08-13 DIAGNOSIS — B9689 Other specified bacterial agents as the cause of diseases classified elsewhere: Secondary | ICD-10-CM

## 2019-08-13 MED ORDER — METRONIDAZOLE 500 MG PO TABS: 500.0000 mg | ORAL_TABLET | Freq: Two times a day (BID) | ORAL | 0 refills | Status: DC

## 2019-08-13 NOTE — Progress Notes (Signed)
Pt called and reports symptoms of BV, vaginal discharge and odor, for the past couple of days. I advised pt that I will send rx to her pharmacy, advised pt to call back if symptoms persist. Pt verbalizes understanding.

## 2019-08-21 ENCOUNTER — Other Ambulatory Visit: Payer: Self-pay

## 2019-08-21 DIAGNOSIS — B3731 Acute candidiasis of vulva and vagina: Secondary | ICD-10-CM

## 2019-08-21 DIAGNOSIS — B373 Candidiasis of vulva and vagina: Secondary | ICD-10-CM

## 2019-08-21 MED ORDER — FLUCONAZOLE 150 MG PO TABS: 150.0000 mg | ORAL_TABLET | Freq: Once | ORAL | 0 refills | Status: AC

## 2021-05-24 ENCOUNTER — Other Ambulatory Visit (HOSPITAL_COMMUNITY)
Admission: RE | Admit: 2021-05-24 | Discharge: 2021-05-24 | Disposition: A | Discharge status: Home / Self Care | Payer: BC Managed Care – PPO | Source: Ambulatory Visit | Attending: Obstetrics | Admitting: Obstetrics

## 2021-05-24 ENCOUNTER — Ambulatory Visit (INDEPENDENT_AMBULATORY_CARE_PROVIDER_SITE_OTHER): Payer: BC Managed Care – PPO | Admitting: Obstetrics

## 2021-05-24 ENCOUNTER — Other Ambulatory Visit: Payer: Self-pay

## 2021-05-24 ENCOUNTER — Encounter: Payer: Self-pay | Admitting: Obstetrics

## 2021-05-24 VITALS — BP 157/99 | HR 84 | Ht 67.0 in | Wt 215.7 lb

## 2021-05-24 DIAGNOSIS — Z01419 Encounter for gynecological examination (general) (routine) without abnormal findings: Secondary | ICD-10-CM | POA: Diagnosis not present

## 2021-05-24 DIAGNOSIS — N898 Other specified noninflammatory disorders of vagina: Secondary | ICD-10-CM | POA: Insufficient documentation

## 2021-05-24 DIAGNOSIS — Z113 Encounter for screening for infections with a predominantly sexual mode of transmission: Secondary | ICD-10-CM

## 2021-05-24 DIAGNOSIS — E669 Obesity, unspecified: Secondary | ICD-10-CM

## 2021-05-24 DIAGNOSIS — Z1239 Encounter for other screening for malignant neoplasm of breast: Secondary | ICD-10-CM | POA: Diagnosis not present

## 2021-05-24 NOTE — Progress Notes (Signed)
Pt is in the office for annual. Last pap 06-09-2018 Pt has not had a mammogram Pt is not on any [facility]

## 2021-05-24 NOTE — Progress Notes (Signed)
Subjective:        Diana Mcbride is a 45 y.o. female here for a routine exam.  Current complaints: Vaginal discharge.  Denies vaginal odor or irritation.  Personal health questionnaire:  Is patient Ashkenazi Jewish, have a family history of breast and/or ovarian cancer: no Is there a family history of uterine cancer diagnosed at age < 60, gastrointestinal cancer, urinary tract cancer, family member who is a Personnel officer syndrome-associated carrier: no Is the patient overweight and hypertensive, family history of diabetes, personal history of gestational diabetes, preeclampsia or PCOS: no Is patient over 18, have PCOS,  family history of premature CHD under age 73, diabetes, smoke, have hypertension or peripheral artery disease:  no At any time, has a partner hit, kicked or otherwise hurt or frightened you?: no Over the past 2 weeks, have you felt down, depressed or hopeless?: no Over the past 2 weeks, have you felt little interest or pleasure in doing things?:no   Gynecologic History Patient's last menstrual period was 05/08/2021. Contraception: none Last Pap: 2019. Results were: normal Last mammogram: none. Results were: none  Obstetric History OB History  Gravida Para Term Preterm AB Living  3 2 2   1 2   SAB IAB Ectopic Multiple Live Births          2    # Outcome Date GA Lbr Len/2nd Weight Sex Delivery Anes PTL Lv  3 Term 03/26/07    F    LIV  2 AB 12/03/97          1 Term 11/18/96    11/20/96   LIV    Past Medical History:  Diagnosis Date   Hirsutism 09/18/2016    Past Surgical History:  Procedure Laterality Date   CESAREAN SECTION     COSMETIC SURGERY  2014   BBL     Current Outpatient Medications:    dexamethasone (DECADRON) 1 MG tablet, Take at 9-10 pm, the night before blood test. (Patient not taking: No sig reported), Disp: 1 tablet, Rfl: 0   fluconazole (DIFLUCAN) 200 MG tablet, Take 1 tablet po every other day. (Patient not taking: Reported on 05/24/2021),  Disp: 3 tablet, Rfl: 2   metroNIDAZOLE (FLAGYL) 500 MG tablet, Take 1 tablet (500 mg total) by mouth 2 (two) times daily. (Patient not taking: Reported on 05/24/2021), Disp: 14 tablet, Rfl: 0   nitrofurantoin, macrocrystal-monohydrate, (MACROBID) 100 MG capsule, Take 1 capsule (100 mg total) by mouth 2 (two) times daily. (Patient not taking: No sig reported), Disp: 14 capsule, Rfl: 2   Prenat-FePoly-Fered-FA-Omega 3 (DUET DHA BALANCED) 25-1 & 267 MG MISC, Take 2 tablets by mouth daily before breakfast. (Patient not taking: Reported on 05/24/2021), Disp: 30 each, Rfl: 11   tinidazole (TINDAMAX) 500 MG tablet, Take 2 tablets (1,000 mg total) by mouth daily with breakfast. (Patient not taking: Reported on 05/24/2021), Disp: 10 tablet, Rfl: 2 No Known Allergies  Social History   Tobacco Use   Smoking status: Former    Pack years: 0.00   Smokeless tobacco: Never  Substance Use Topics   Alcohol use: Yes    Alcohol/week: 0.0 standard drinks    Comment: occasionally    Family History  Problem Relation Age of Onset   Diabetes Mother    Hypertension Mother    Diabetes Father    Cancer Paternal Grandmother        Unsure which per pt    Infertility Neg Hx       Review of  Systems  Constitutional: negative for fatigue and weight loss Respiratory: negative for cough and wheezing Cardiovascular: negative for chest pain, fatigue and palpitations Gastrointestinal: negative for abdominal pain and change in bowel habits Musculoskeletal:negative for myalgias Neurological: negative for gait problems and tremors Behavioral/Psych: negative for abusive relationship, depression Endocrine: negative for temperature intolerance    Genitourinary: positive for vaginal discharge.  negative for abnormal menstrual periods, genital lesions, hot flashes, sexual problems  Integument/breast: negative for breast lump, breast tenderness, nipple discharge and skin lesion(s)    Objective:       BP (!) 157/99    Pulse 84   Ht 5\' 7"  (1.702 m)   Wt 215 lb 11.2 oz (97.8 kg)   LMP 05/08/2021   BMI 33.78 kg/m  General:   Alert and no distress  Skin:   no rash or abnormalities  Lungs:   clear to auscultation bilaterally  Heart:   regular rate and rhythm, S1, S2 normal, no murmur, click, rub or gallop  Breasts:   normal without suspicious masses, skin or nipple changes or axillary nodes  Abdomen:  normal findings: no organomegaly, soft, non-tender and no hernia  Pelvis:  External genitalia: normal general appearance Urinary system: urethral meatus normal and bladder without fullness, nontender Vaginal: normal without tenderness, induration or masses Cervix: normal appearance Adnexa: normal bimanual exam Uterus: anteverted and non-tender, normal size   Lab Review Urine pregnancy test Labs reviewed yes Radiologic studies reviewed yes  I have spent a total of 20 minutes of face-to-face time, excluding clinical staff time, reviewing notes and preparing to see patient, ordering tests and/or medications, and counseling the patient.   Assessment:    1. Encounter for gynecological examination with Papanicolaou smear of cervix Rx: - Cytology - PAP( [place] [facility])  2. Vaginal discharge Rx: - Cervicovaginal ancillary only( [place])  3. Screening for STD (sexually transmitted disease) Rx: - HIV Antibody (routine testing w rflx) - RPR - Hepatitis C antibody - Hepatitis B surface antigen  4. Screening breast examination Rx: - MM Digital Screening; Future  5. Obesity (BMI 30.0-34.9) - weight reduction with the aid of decreased calories, exercise and behavioral modification recommended      Plan:    Education reviewed: calcium supplements, depression evaluation, low fat, low cholesterol diet, safe sex/STD prevention, self breast exams, and weight bearing exercise. Contraception: none. Follow up in: 1 year.    Orders Placed This Encounter  Procedures   MM Digital Screening    Standing  Status:   Future    Standing Expiration Date:   05/24/2022    Order Specific Question:   Reason for Exam (SYMPTOM  OR DIAGNOSIS REQUIRED)    Answer:   Screening    Order Specific Question:   Is the patient pregnant?    Answer:   No    Order Specific Question:   Preferred imaging location?    Answer:   [facility]   HIV Antibody (routine testing w rflx)   RPR   Hepatitis C antibody   Hepatitis B surface antigen      05/26/2022, MD 05/24/2021 5:14 PM

## 2021-05-25 LAB — CERVICOVAGINAL ANCILLARY ONLY
Bacterial Vaginitis (gardnerella): POSITIVE — AB
Candida Glabrata: NEGATIVE
Candida Vaginitis: POSITIVE — AB
Chlamydia: NEGATIVE
Comment: NEGATIVE
Comment: NEGATIVE
Comment: NEGATIVE
Comment: NEGATIVE
Comment: NEGATIVE
Comment: NORMAL
Neisseria Gonorrhea: NEGATIVE
Trichomonas: NEGATIVE

## 2021-05-25 LAB — HIV ANTIBODY (ROUTINE TESTING W REFLEX): HIV Screen 4th Generation wRfx: NONREACTIVE

## 2021-05-25 LAB — HEPATITIS B SURFACE ANTIGEN: Hepatitis B Surface Ag: NEGATIVE

## 2021-05-25 LAB — RPR: RPR Ser Ql: NONREACTIVE

## 2021-05-25 LAB — HEPATITIS C ANTIBODY: Hep C Virus Ab: <0.1 s/co ratio (ref 0.0–0.9)

## 2021-05-26 ENCOUNTER — Other Ambulatory Visit: Payer: Self-pay | Admitting: Obstetrics

## 2021-05-26 DIAGNOSIS — B373 Candidiasis of vulva and vagina: Secondary | ICD-10-CM

## 2021-05-26 DIAGNOSIS — N898 Other specified noninflammatory disorders of vagina: Secondary | ICD-10-CM

## 2021-05-26 DIAGNOSIS — B3731 Acute candidiasis of vulva and vagina: Secondary | ICD-10-CM

## 2021-05-26 DIAGNOSIS — R399 Unspecified symptoms and signs involving the genitourinary system: Secondary | ICD-10-CM

## 2021-05-26 MED ORDER — FLUCONAZOLE 200 MG PO TABS: ORAL_TABLET | ORAL | 2 refills | Status: DC

## 2021-05-26 MED ORDER — TINIDAZOLE 500 MG PO TABS: 1000.0000 mg | ORAL_TABLET | Freq: Every day | ORAL | 2 refills | Status: AC

## 2021-05-29 ENCOUNTER — Telehealth: Payer: Self-pay

## 2021-05-29 NOTE — Telephone Encounter (Signed)
Patient reviewed test results via-my chart 

## 2021-05-29 NOTE — Telephone Encounter (Signed)
-----   Message from Brock Bad, MD sent at 05/26/2021  3:29 PM EDT ----- Tinidazole Rx for BV Diflucan Rx for yeast

## 2021-05-30 LAB — CYTOLOGY - PAP
Comment: NEGATIVE
Diagnosis: UNDETERMINED — AB
High risk HPV: NEGATIVE

## 2021-05-31 ENCOUNTER — Other Ambulatory Visit: Payer: Self-pay

## 2021-05-31 ENCOUNTER — Ambulatory Visit (HOSPITAL_BASED_OUTPATIENT_CLINIC_OR_DEPARTMENT_OTHER)
Admission: RE | Admit: 2021-05-31 | Discharge: 2021-05-31 | Disposition: A | Discharge status: Home / Self Care | Payer: BC Managed Care – PPO | Source: Ambulatory Visit | Attending: Obstetrics | Admitting: Obstetrics

## 2021-05-31 DIAGNOSIS — Z1239 Encounter for other screening for malignant neoplasm of breast: Secondary | ICD-10-CM

## 2021-05-31 DIAGNOSIS — Z1231 Encounter for screening mammogram for malignant neoplasm of breast: Secondary | ICD-10-CM | POA: Insufficient documentation

## 2022-10-01 ENCOUNTER — Ambulatory Visit: Payer: BC Managed Care – PPO | Admitting: Advanced Practice Midwife

## 2022-11-07 ENCOUNTER — Other Ambulatory Visit (HOSPITAL_COMMUNITY)
Admission: RE | Admit: 2022-11-07 | Discharge: 2022-11-07 | Disposition: A | Discharge status: Home / Self Care | Payer: BC Managed Care – PPO | Source: Ambulatory Visit | Attending: Obstetrics and Gynecology | Admitting: Obstetrics and Gynecology

## 2022-11-07 ENCOUNTER — Encounter: Payer: Self-pay | Admitting: Obstetrics

## 2022-11-07 ENCOUNTER — Ambulatory Visit (INDEPENDENT_AMBULATORY_CARE_PROVIDER_SITE_OTHER): Payer: BC Managed Care – PPO | Admitting: Obstetrics

## 2022-11-07 VITALS — BP 141/91 | HR 81 | Ht 67.0 in | Wt 214.0 lb

## 2022-11-07 DIAGNOSIS — N898 Other specified noninflammatory disorders of vagina: Secondary | ICD-10-CM | POA: Insufficient documentation

## 2022-11-07 DIAGNOSIS — Z113 Encounter for screening for infections with a predominantly sexual mode of transmission: Secondary | ICD-10-CM

## 2022-11-07 DIAGNOSIS — E669 Obesity, unspecified: Secondary | ICD-10-CM

## 2022-11-07 DIAGNOSIS — Z1239 Encounter for other screening for malignant neoplasm of breast: Secondary | ICD-10-CM | POA: Diagnosis not present

## 2022-11-07 DIAGNOSIS — Z01419 Encounter for gynecological examination (general) (routine) without abnormal findings: Secondary | ICD-10-CM

## 2022-11-07 MED ORDER — FLUCONAZOLE 150 MG PO TABS: 150.0000 mg | ORAL_TABLET | Freq: Once | ORAL | 0 refills | Status: DC

## 2022-11-07 MED ORDER — METRONIDAZOLE 500 MG PO TABS: 500.0000 mg | ORAL_TABLET | Freq: Two times a day (BID) | ORAL | 2 refills | Status: AC

## 2022-11-07 NOTE — Progress Notes (Signed)
Subjective:        Diana Mcbride is a 46 y.o. female here for a routine exam.  Current complaints: Vaginal discharge.    Personal health questionnaire:  Is patient Ashkenazi Jewish, have a family history of breast and/or ovarian cancer: no Is there a family history of uterine cancer diagnosed at age < 28, gastrointestinal cancer, urinary tract cancer, family member who is a Personnel officer syndrome-associated carrier: no Is the patient overweight and hypertensive, family history of diabetes, personal history of gestational diabetes, preeclampsia or PCOS: no Is patient over 37, have PCOS,  family history of premature CHD under age 51, diabetes, smoke, have hypertension or peripheral artery disease:  no At any time, has a partner hit, kicked or otherwise hurt or frightened you?: no Over the past 2 weeks, have you felt down, depressed or hopeless?: no Over the past 2 weeks, have you felt little interest or pleasure in doing things?:no   Gynecologic History Patient's last menstrual period was 10/19/2022 (approximate). Contraception: none Last Pap: 2022. Results were: ASCUS with negative HRHPV Last mammogram: 2022. Results were: normal  Obstetric History OB History  Gravida Para Term Preterm AB Living  3 2 2   1 2   SAB IAB Ectopic Multiple Live Births          2    # Outcome Date GA Lbr Len/2nd Weight Sex Delivery Anes PTL Lv  3 Term 03/26/07    F    LIV  2 AB 12/03/97          1 Term 11/18/96    11/20/96   LIV    Past Medical History:  Diagnosis Date   Hirsutism 09/18/2016    Past Surgical History:  Procedure Laterality Date   CESAREAN SECTION     COSMETIC SURGERY  2014   BBL     Current Outpatient Medications:    fluconazole (DIFLUCAN) 150 MG tablet, Take 1 tablet (150 mg total) by mouth once for 1 dose., Disp: 1 tablet, Rfl: 0   losartan (COZAAR) 25 MG tablet, Take 1 tablet by mouth daily., Disp: , Rfl:    metroNIDAZOLE (FLAGYL) 500 MG tablet, Take 1 tablet (500 mg  total) by mouth 2 (two) times daily., Disp: 14 tablet, Rfl: 2   dexamethasone (DECADRON) 1 MG tablet, Take at 9-10 pm, the night before blood test. (Patient not taking: No sig reported), Disp: 1 tablet, Rfl: 0   fluconazole (DIFLUCAN) 200 MG tablet, Take 1 tablet po every other day. (Patient not taking: Reported on 11/07/2022), Disp: 3 tablet, Rfl: 2   Prenat-FePoly-Fered-FA-Omega 3 (DUET DHA BALANCED) 25-1 & 267 MG MISC, Take 2 tablets by mouth daily before breakfast. (Patient not taking: Reported on 05/24/2021), Disp: 30 each, Rfl: 11   tinidazole (TINDAMAX) 500 MG tablet, Take 2 tablets (1,000 mg total) by mouth daily with breakfast. (Patient not taking: Reported on 11/07/2022), Disp: 10 tablet, Rfl: 2 No Known Allergies  Social History   Tobacco Use   Smoking status: Former   Smokeless tobacco: Never  Substance Use Topics   Alcohol use: Yes    Alcohol/week: 0.0 standard drinks of alcohol    Comment: occasionally    Family History  Problem Relation Age of Onset   Diabetes Mother    Hypertension Mother    Diabetes Father    Cancer Paternal Grandmother        Unsure which per pt    Infertility Neg Hx       Review of  Systems  Constitutional: negative for fatigue and weight loss Respiratory: negative for cough and wheezing Cardiovascular: negative for chest pain, fatigue and palpitations Gastrointestinal: negative for abdominal pain and change in bowel habits Musculoskeletal:negative for myalgias Neurological: negative for gait problems and tremors Behavioral/Psych: negative for abusive relationship, depression Endocrine: negative for temperature intolerance    Genitourinary: positive for vaginal discharge.  negative for abnormal menstrual periods, genital lesions, hot flashes, sexual problems  Integument/breast: negative for breast lump, breast tenderness, nipple discharge and skin lesion(s)    Objective:       BP (!) 141/91 (BP Location: Left Arm, Cuff Size: Normal)   Pulse  81   Ht 5\' 7"  (1.702 m)   Wt 214 lb (97.1 kg)   LMP 10/19/2022 (Approximate)   BMI 33.52 kg/m  General:   Alert and no acute distress  Skin:   no rash or abnormalities  Lungs:   clear to auscultation bilaterally  Heart:   regular rate and rhythm, S1, S2 normal, no murmur, click, rub or gallop  Breasts:   normal without suspicious masses, skin or nipple changes or axillary nodes  Abdomen:  normal findings: no organomegaly, soft, non-tender and no hernia  Pelvis:  External genitalia: normal general appearance Urinary system: urethral meatus normal and bladder without fullness, nontender Vaginal: normal without tenderness, induration or masses Cervix: normal appearance Adnexa: normal bimanual exam Uterus: anteverted and non-tender, normal size   Lab Review Urine pregnancy test Labs reviewed yes Radiologic studies reviewed yes  I have spent a total of 20 minutes of face-to-face  time, excluding clinical staff time, reviewing notes and preparing to see patient, ordering tests and/or medications, and counseling the patient.   Assessment:    1. Encounter for gynecological examination with Papanicolaou smear of cervix Rx: - Cytology - PAP( [place])  2. Vaginal discharge Rx: - Cervicovaginal ancillary only( [place]) - metroNIDAZOLE (FLAGYL) 500 MG tablet; Take 1 tablet (500 mg total) by mouth 2 (two) times daily.  Dispense: 14 tablet; Refill: 2 - fluconazole (DIFLUCAN) 150 MG tablet; Take 1 tablet (150 mg total) by mouth once for 1 dose.  Dispense: 1 tablet; Refill: 0  3. Screen for STD (sexually transmitted disease) X: - HIV antibody (with reflex) - Hepatitis C Antibody - Hepatitis B Surface AntiGEN - RPR  4. Screening breast examination Rx: - MM 3D SCREEN BREAST BILATERAL; Future  5. Obesity (BMI 30-39.9) - weight reduction recommended    Plan:    Education reviewed: calcium supplements, depression evaluation, low fat, low cholesterol diet, safe sex/STD  prevention, self breast exams, and weight bearing exercise. Contraception: none. Mammogram ordered. Follow up in: 1 year.   Meds ordered this encounter  Medications   metroNIDAZOLE (FLAGYL) 500 MG tablet    Sig: Take 1 tablet (500 mg total) by mouth 2 (two) times daily.    Dispense:  14 tablet    Refill:  2   fluconazole (DIFLUCAN) 150 MG tablet    Sig: Take 1 tablet (150 mg total) by mouth once for 1 dose.    Dispense:  1 tablet    Refill:  0   Orders Placed This Encounter  Procedures   MM 3D SCREEN BREAST BILATERAL    Standing Status:   Future    Standing Expiration Date:   11/06/2023    Order Specific Question:   Reason for Exam (SYMPTOM  OR DIAGNOSIS REQUIRED)    Answer:   Screening    Order Specific Question:   Is  the patient pregnant?    Answer:   No    Order Specific Question:   Preferred imaging location?    Answer:   [facility]   HIV antibody (with reflex)   Hepatitis C Antibody   Hepatitis B Surface AntiGEN   RPR    Brock Bad, MD 11/07/2022 8:59 AM

## 2022-11-07 NOTE — Progress Notes (Signed)
46 y.o GYN presents for AEX/PAP/STD Screening

## 2022-11-08 ENCOUNTER — Other Ambulatory Visit: Payer: Self-pay | Admitting: Obstetrics

## 2022-11-08 LAB — HIV ANTIBODY (ROUTINE TESTING W REFLEX): HIV Screen 4th Generation wRfx: NONREACTIVE

## 2022-11-08 LAB — CERVICOVAGINAL ANCILLARY ONLY
Bacterial Vaginitis (gardnerella): POSITIVE — AB
Candida Glabrata: NEGATIVE
Candida Vaginitis: NEGATIVE
Chlamydia: NEGATIVE
Comment: NEGATIVE
Comment: NEGATIVE
Comment: NEGATIVE
Comment: NEGATIVE
Comment: NEGATIVE
Comment: NORMAL
Neisseria Gonorrhea: NEGATIVE
Trichomonas: NEGATIVE

## 2022-11-08 LAB — RPR: RPR Ser Ql: NONREACTIVE

## 2022-11-08 LAB — HEPATITIS B SURFACE ANTIGEN: Hepatitis B Surface Ag: NEGATIVE

## 2022-11-08 LAB — HEPATITIS C ANTIBODY: Hep C Virus Ab: NONREACTIVE

## 2022-11-09 ENCOUNTER — Telehealth: Payer: Self-pay

## 2022-11-09 NOTE — Telephone Encounter (Signed)
Called patient and informed her of her BV results and that Flagyl was sent in to her pharmacy. Pt informed me she will pick up her prescription today. No questions or concerns at this time.

## 2022-11-13 LAB — CYTOLOGY - PAP
Comment: NEGATIVE
Diagnosis: NEGATIVE
Diagnosis: REACTIVE
High risk HPV: NEGATIVE

## 2022-11-21 ENCOUNTER — Ambulatory Visit (HOSPITAL_BASED_OUTPATIENT_CLINIC_OR_DEPARTMENT_OTHER)
Admission: RE | Admit: 2022-11-21 | Discharge: 2022-11-21 | Disposition: A | Discharge status: Home / Self Care | Payer: BC Managed Care – PPO | Source: Ambulatory Visit | Attending: Obstetrics | Admitting: Obstetrics

## 2022-11-21 DIAGNOSIS — Z1239 Encounter for other screening for malignant neoplasm of breast: Secondary | ICD-10-CM

## 2022-11-21 DIAGNOSIS — Z1231 Encounter for screening mammogram for malignant neoplasm of breast: Secondary | ICD-10-CM | POA: Insufficient documentation

## 2022-12-05 IMAGING — MG MM DIGITAL SCREENING BILAT W/ TOMO AND CAD
8 series · 8 of 24 positions shown · non-contrast
Comparison: None.

CLINICAL DATA: Screening.

EXAM:
DIGITAL SCREENING BILATERAL MAMMOGRAM WITH TOMOSYNTHESIS AND CAD
TECHNIQUE: Bilateral screening digital craniocaudal and mediolateral oblique
mammograms were obtained. Bilateral screening digital breast
tomosynthesis was performed. The images were evaluated with
computer-aided detection.

[L MLO synth-2D]
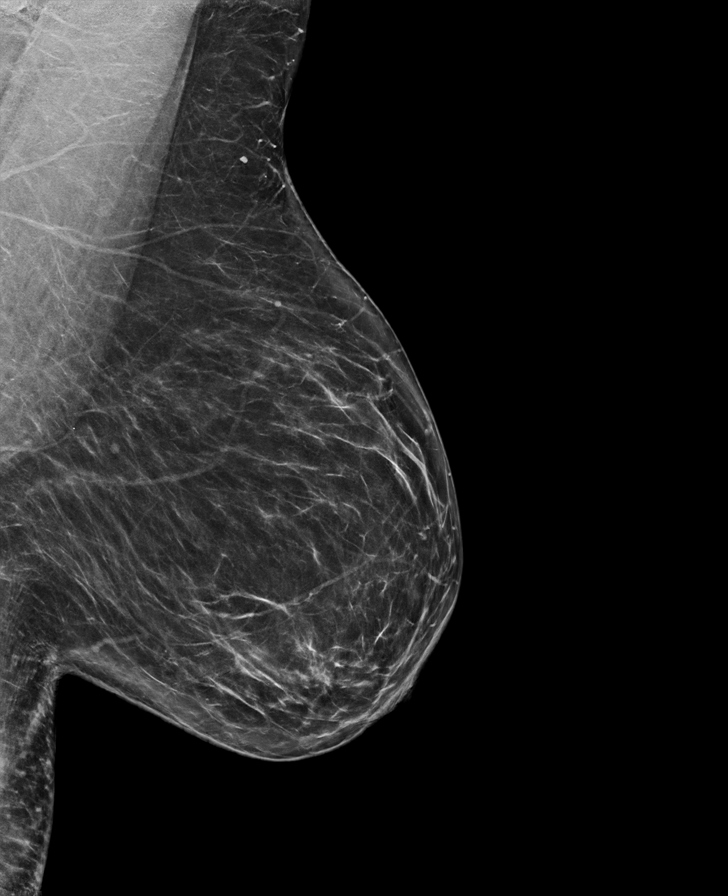

[R MLO synth-2D]
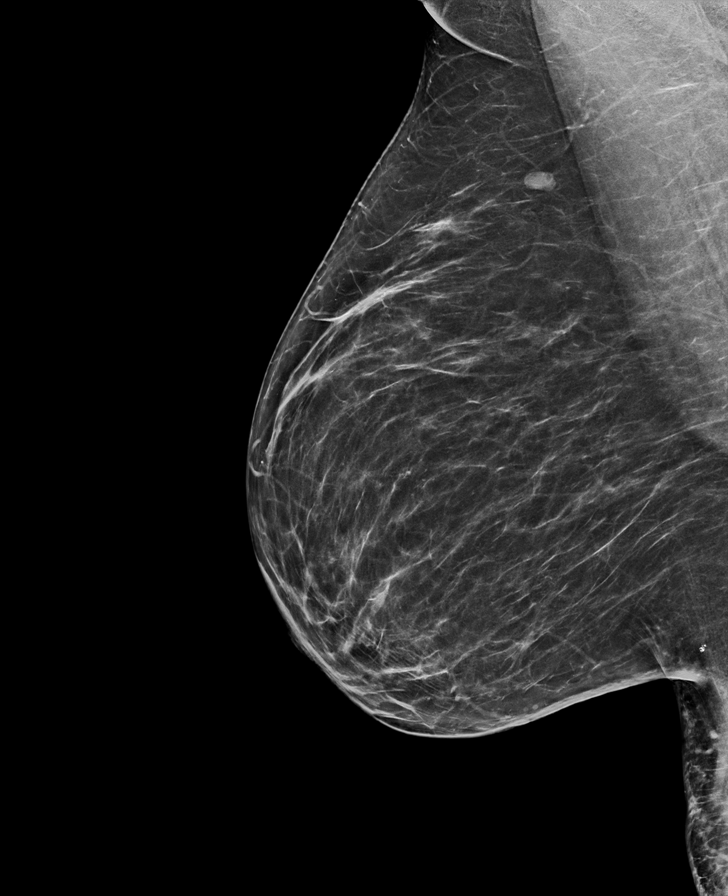

[R CC synth-2D]
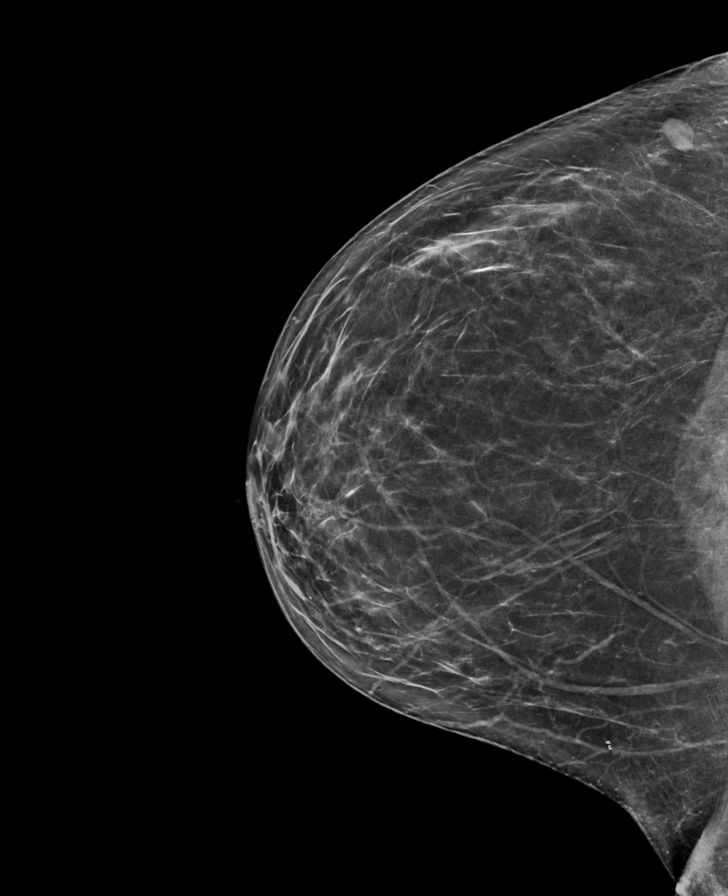

[L CC synth-2D]
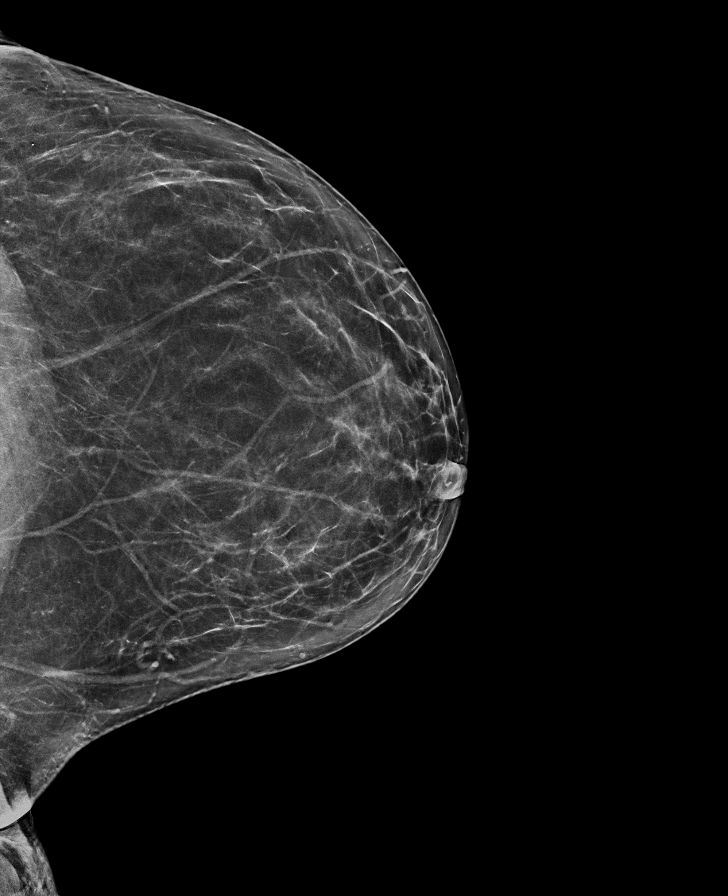

[L MLO tomo · tomo slice 39/77.0]
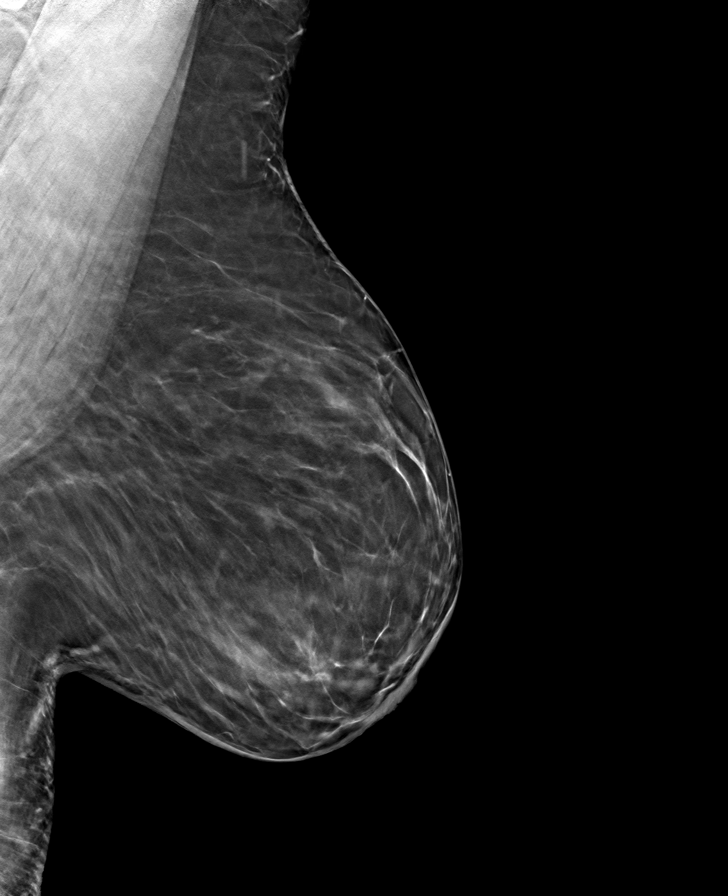

[R MLO tomo · tomo slice 39/77.0]
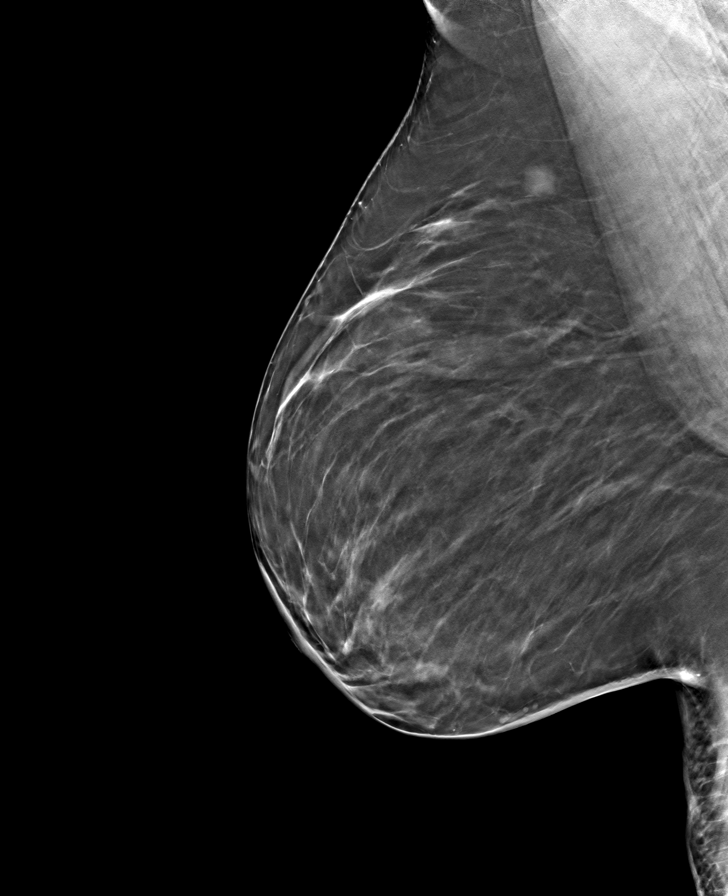

[R CC tomo · tomo slice 37/72.0]
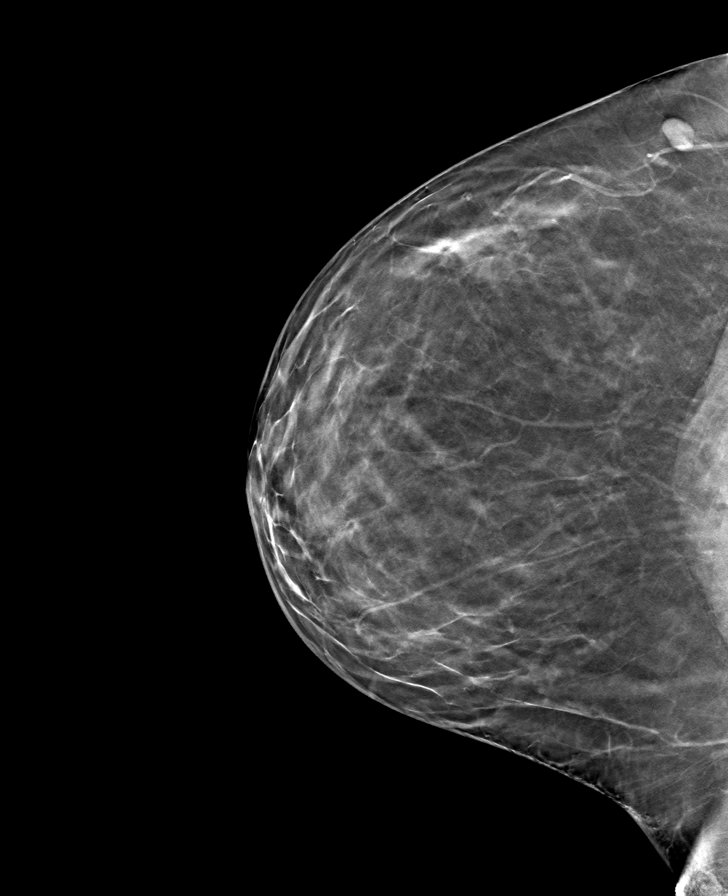

[L CC tomo · tomo slice 35/70.0]
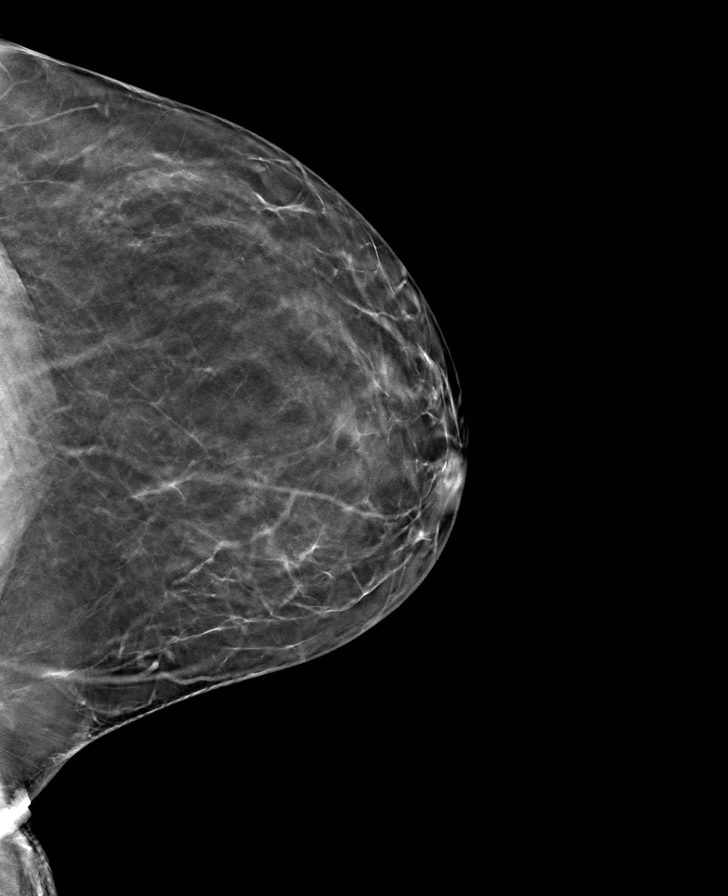

[8 of 24 positions shown; findings below may reference images not displayed]

ACR Breast Density Category b: There are scattered areas of
fibroglandular density.
FINDINGS: There are no findings suspicious for malignancy.
IMPRESSION: No mammographic evidence of malignancy. A result letter of this
screening mammogram will be mailed directly to the patient.

RECOMMENDATION:
Screening mammogram in one year. (Code:[id number]-X-X7B)

BI-RADS CATEGORY  1: Negative.

## 2023-07-10 ENCOUNTER — Other Ambulatory Visit: Payer: Self-pay | Admitting: Obstetrics

## 2023-07-10 DIAGNOSIS — N898 Other specified noninflammatory disorders of vagina: Secondary | ICD-10-CM

## 2024-04-10 ENCOUNTER — Encounter: Payer: Self-pay | Admitting: Obstetrics

## 2024-04-10 ENCOUNTER — Ambulatory Visit: Admitting: Obstetrics

## 2024-04-10 ENCOUNTER — Other Ambulatory Visit (HOSPITAL_COMMUNITY)
Admission: RE | Admit: 2024-04-10 | Discharge: 2024-04-10 | Disposition: A | Discharge status: Home / Self Care | Source: Ambulatory Visit | Attending: Obstetrics | Admitting: Obstetrics

## 2024-04-10 VITALS — BP 139/96 | HR 68 | Ht 67.5 in | Wt 222.2 lb

## 2024-04-10 DIAGNOSIS — N898 Other specified noninflammatory disorders of vagina: Secondary | ICD-10-CM | POA: Diagnosis not present

## 2024-04-10 DIAGNOSIS — Z1339 Encounter for screening examination for other mental health and behavioral disorders: Secondary | ICD-10-CM

## 2024-04-10 DIAGNOSIS — Z1239 Encounter for other screening for malignant neoplasm of breast: Secondary | ICD-10-CM

## 2024-04-10 DIAGNOSIS — E669 Obesity, unspecified: Secondary | ICD-10-CM

## 2024-04-10 DIAGNOSIS — Z01419 Encounter for gynecological examination (general) (routine) without abnormal findings: Secondary | ICD-10-CM | POA: Diagnosis present

## 2024-04-10 DIAGNOSIS — Z113 Encounter for screening for infections with a predominantly sexual mode of transmission: Secondary | ICD-10-CM | POA: Diagnosis not present

## 2024-04-10 MED ORDER — DUET DHA BALANCED 25-1 & 267 MG PO MISC: 2.0000 | Freq: Every day | ORAL | 11 refills | Status: AC

## 2024-04-10 NOTE — Progress Notes (Signed)
 Subjective:        Diana Mcbride is a 48 y.o. female here for a routine exam.  Current complaints: Vaginal discharge.    Personal health questionnaire:  Is patient Ashkenazi Jewish, have a family history of breast and/or ovarian cancer: no Is there a family history of uterine cancer diagnosed at age < 4, gastrointestinal cancer, urinary tract cancer, family member who is a Personnel officer syndrome-associated carrier: no Is the patient overweight and hypertensive, family history of diabetes, personal history of gestational diabetes, preeclampsia or PCOS: yes Is patient over 21, have PCOS,  family history of premature CHD under age 28, diabetes, smoke, have hypertension or peripheral artery disease:  no At any time, has a partner hit, kicked or otherwise hurt or frightened you?: no Over the past 2 weeks, have you felt down, depressed or hopeless?: no Over the past 2 weeks, have you felt little interest or pleasure in doing things?:no   Gynecologic History Patient's last menstrual period was 03/15/2024 (approximate). Contraception: condoms Last Pap: 2023. Results were: normal Last mammogram: 2023. Results were: normal  Obstetric History OB History  Gravida Para Term Preterm AB Living  3 2 2  1 2   SAB IAB Ectopic Multiple Live Births      2    # Outcome Date GA Lbr Len/2nd Weight Sex Type Anes PTL Lv  3 Term 03/26/07    F    LIV  2 AB 12/03/97          1 Term 11/18/96    [place] [facility]   LIV    Past Medical History:  Diagnosis Date   Hirsutism 09/18/2016    Past Surgical History:  Procedure Laterality Date   CESAREAN SECTION     COSMETIC SURGERY  2014   BBL     Current Outpatient Medications:    losartan (COZAAR) 25 MG tablet, Take 1 tablet by mouth daily., Disp: , Rfl:    Semaglutide-Weight Management (WEGOVY) 0.25 MG/0.5ML SOAJ, Inject 0.25 mg into the skin once a week., Disp: , Rfl:    dexamethasone  (DECADRON ) 1 MG tablet, Take at 9-10 pm, the night before blood test.  (Patient not taking: Reported on 03/14/2017), Disp: 1 tablet, Rfl: 0   fluconazole  (DIFLUCAN ) 200 MG tablet, Take 1 tablet po every other day. (Patient not taking: Reported on 04/10/2024), Disp: 3 tablet, Rfl: 2   metroNIDAZOLE  (FLAGYL ) 500 MG tablet, Take 1 tablet (500 mg total) by mouth 2 (two) times daily. (Patient not taking: Reported on 04/10/2024), Disp: 14 tablet, Rfl: 2   Prenat-FePoly-Fered-FA-Omega 3 (DUET DHA  BALANCED) 25-1 & 267 MG MISC, Take 2 tablets by mouth daily before breakfast., Disp: 30 each, Rfl: 11   tinidazole  (TINDAMAX ) 500 MG tablet, Take 2 tablets (1,000 mg total) by mouth daily with breakfast. (Patient not taking: Reported on 04/10/2024), Disp: 10 tablet, Rfl: 2 No Known Allergies  Social History   Tobacco Use   Smoking status: Never   Smokeless tobacco: Never  Substance Use Topics   Alcohol use: Yes    Alcohol/week: 0.0 standard drinks of alcohol    Comment: occasionally    Family History  Problem Relation Age of Onset   Diabetes Mother    Hypertension Mother    Diabetes Father    Cancer Paternal Grandmother        Unsure which per pt    Infertility Neg Hx       Review of Systems  Constitutional: negative for fatigue and weight loss Respiratory: negative  for cough and wheezing Cardiovascular: negative for chest pain, fatigue and palpitations Gastrointestinal: negative for abdominal pain and change in bowel habits Musculoskeletal:negative for myalgias Neurological: negative for gait problems and tremors Behavioral/Psych: negative for abusive relationship, depression Endocrine: negative for temperature intolerance    Genitourinary: positive for vaginal discharge.  negative for abnormal menstrual periods, genital lesions, hot flashes, sexual problems  Integument/breast: negative for breast lump, breast tenderness, nipple discharge and skin lesion(s)    Objective:       BP (!) 139/96   Pulse 68   Ht 5' 7.5" (1.715 m)   Wt 222 lb 3.2 oz (100.8 kg)   LMP  03/15/2024 (Approximate)   BMI 34.29 kg/m  General:   Alert and no distress  Skin:   no rash or abnormalities  Lungs:   clear to auscultation bilaterally  Heart:   regular rate and rhythm, S1, S2 normal, no murmur, click, rub or gallop  Breasts:   normal without suspicious masses, skin or nipple changes or axillary nodes  Abdomen:  normal findings: no organomegaly, soft, non-tender and no hernia  Pelvis:  External genitalia: normal general appearance Urinary system: urethral meatus normal and bladder without fullness, nontender Vaginal: normal without tenderness, induration or masses Cervix: normal appearance Adnexa: normal bimanual exam Uterus: anteverted and non-tender, normal size   Lab Review Urine pregnancy test Labs reviewed yes Radiologic studies reviewed yes  I have spent a total of 30 minutes of face-to-face time, excluding clinical staff time, reviewing notes and preparing to see patient, ordering tests and/or medications, and counseling the patient.   Assessment:    1. Encounter for gynecological examination with Papanicolaou smear of cervix (Primary) Rx: - Cytology - PAP( [place]) - Prenat-FePoly-Fered-FA-Omega 3 (DUET DHA  BALANCED) 25-1 & 267 MG MISC; Take 2 tablets by mouth daily before breakfast.  Dispense: 30 each; Refill: 11  2. Vaginal discharge Rx: - Cervicovaginal ancillary only( [place])  3. Screen for STD (sexually transmitted disease) Rx: - RPR - HIV antibody (with reflex) - Hepatitis C Antibody - Hepatitis B Surface AntiGEN  4. Screening breast examination Rx: - MM Digital Screening; Future  5. Obesity (BMI 30-39.9) - weight reduction with the aid of dietary changes, exercise and behavioral modification recommended     Plan:    Education reviewed: calcium supplements, depression evaluation, low fat, low cholesterol diet, safe sex/STD prevention, self breast exams, and weight bearing exercise. Contraception: none. Mammogram  ordered. Follow up in: 1 year.   Meds ordered this encounter  Medications   Prenat-FePoly-Fered-FA-Omega 3 (DUET DHA  BALANCED) 25-1 & 267 MG MISC    Sig: Take 2 tablets by mouth daily before breakfast.    Dispense:  30 each    Refill:  11   Orders Placed This Encounter  Procedures   MM Digital Screening    Standing Status:   Future    Expiration Date:   04/10/2025    Reason for Exam (SYMPTOM  OR DIAGNOSIS REQUIRED):   Screening    Is the patient pregnant?:   No    Preferred imaging location?:   [facility]   RPR   HIV antibody (with reflex)   Hepatitis C Antibody   Hepatitis B Surface AntiGEN    Gabrielle Joiner, MD, FACOG Attending Obstetrician & Gynecologist, [facility] for [facility], [facility], [place] 04/10/2024

## 2024-04-11 LAB — HEPATITIS B SURFACE ANTIGEN: Hepatitis B Surface Ag: NEGATIVE

## 2024-04-11 LAB — RPR: RPR Ser Ql: NONREACTIVE

## 2024-04-11 LAB — HIV ANTIBODY (ROUTINE TESTING W REFLEX): HIV Screen 4th Generation wRfx: NONREACTIVE

## 2024-04-11 LAB — HEPATITIS C ANTIBODY: Hep C Virus Ab: NONREACTIVE

## 2024-04-13 LAB — CERVICOVAGINAL ANCILLARY ONLY
Bacterial Vaginitis (gardnerella): NEGATIVE
Candida Glabrata: NEGATIVE
Candida Vaginitis: NEGATIVE
Chlamydia: NEGATIVE
Comment: NEGATIVE
Comment: NEGATIVE
Comment: NEGATIVE
Comment: NEGATIVE
Comment: NEGATIVE
Comment: NORMAL
Neisseria Gonorrhea: NEGATIVE
Trichomonas: NEGATIVE

## 2024-04-14 LAB — CYTOLOGY - PAP
Comment: NEGATIVE
Diagnosis: NEGATIVE
High risk HPV: NEGATIVE

## 2024-04-15 ENCOUNTER — Ambulatory Visit: Payer: Self-pay | Admitting: Family Medicine

## 2024-07-13 ENCOUNTER — Other Ambulatory Visit: Payer: Self-pay

## 2024-07-13 DIAGNOSIS — B3731 Acute candidiasis of vulva and vagina: Secondary | ICD-10-CM

## 2024-07-13 MED ORDER — FLUCONAZOLE 200 MG PO TABS: ORAL_TABLET | ORAL | 2 refills | Status: AC

## 2024-07-17 ENCOUNTER — Telehealth: Payer: Self-pay | Admitting: *Deleted

## 2024-07-17 NOTE — Telephone Encounter (Signed)
 Spoke with pt regarding labs at last visit. Pt wanted to know if her test included HSV.  Pt made aware that test was not done, just routine std screening. Pt has some concerns as she is having some vaginal burning with intercourse, thought was yeast, currently has treatment.  Pt has concerns for HSV as her partner is positive although she has no current lesions/bumps.   Pt advised to continue yeast treatment as prescribed, if vaginal symptoms continue she will need an appt to evaluate.
